# Patient Record
Sex: Female | Born: 1980 | Race: White | Hispanic: No | Marital: Single | Smoking: Former smoker
Health system: Southern US, Community
[De-identification: ages and names within clinical notes are randomized; demographics above are authoritative.]

## PROBLEM LIST (undated history)

## (undated) DIAGNOSIS — R51 Headache: Secondary | ICD-10-CM

## (undated) DIAGNOSIS — Z8709 Personal history of other diseases of the respiratory system: Secondary | ICD-10-CM

## (undated) DIAGNOSIS — Z789 Other specified health status: Secondary | ICD-10-CM

## (undated) DIAGNOSIS — F419 Anxiety disorder, unspecified: Secondary | ICD-10-CM

## (undated) DIAGNOSIS — K219 Gastro-esophageal reflux disease without esophagitis: Secondary | ICD-10-CM

## (undated) DIAGNOSIS — I209 Angina pectoris, unspecified: Secondary | ICD-10-CM

## (undated) DIAGNOSIS — Z8489 Family history of other specified conditions: Secondary | ICD-10-CM

## (undated) DIAGNOSIS — F32A Depression, unspecified: Secondary | ICD-10-CM

## (undated) DIAGNOSIS — F329 Major depressive disorder, single episode, unspecified: Secondary | ICD-10-CM

## (undated) DIAGNOSIS — R519 Headache, unspecified: Secondary | ICD-10-CM

## (undated) DIAGNOSIS — C801 Malignant (primary) neoplasm, unspecified: Secondary | ICD-10-CM

## (undated) HISTORY — PX: ABDOMINAL HYSTERECTOMY: SHX81

---

## 2009-11-03 ENCOUNTER — Emergency Department (HOSPITAL_COMMUNITY): Admission: EM | Admit: 2009-11-03 | Discharge: 2009-11-03 | Payer: Self-pay | Admitting: Emergency Medicine

## 2010-11-28 ENCOUNTER — Inpatient Hospital Stay (HOSPITAL_COMMUNITY)
Admission: AD | Admit: 2010-11-28 | Discharge: 2010-11-28 | Disposition: A | Discharge status: Home / Self Care | Payer: BLUE CROSS/BLUE SHIELD | Source: Ambulatory Visit | Attending: Obstetrics & Gynecology | Admitting: Obstetrics & Gynecology

## 2010-11-28 DIAGNOSIS — N949 Unspecified condition associated with female genital organs and menstrual cycle: Secondary | ICD-10-CM | POA: Insufficient documentation

## 2010-11-28 DIAGNOSIS — N938 Other specified abnormal uterine and vaginal bleeding: Secondary | ICD-10-CM | POA: Insufficient documentation

## 2010-11-28 DIAGNOSIS — R109 Unspecified abdominal pain: Secondary | ICD-10-CM | POA: Insufficient documentation

## 2010-11-28 LAB — URINALYSIS, ROUTINE W REFLEX MICROSCOPIC
Bilirubin Urine: NEGATIVE
Glucose, UA: NEGATIVE mg/dL
Nitrite: NEGATIVE
Protein, ur: NEGATIVE mg/dL
Specific Gravity, Urine: 1.02 (ref 1.005–1.030)

## 2010-11-28 LAB — URINE MICROSCOPIC-ADD ON

## 2010-11-29 ENCOUNTER — Emergency Department (HOSPITAL_COMMUNITY)
Admission: EM | Admit: 2010-11-29 | Discharge: 2010-11-29 | Disposition: A | Discharge status: Home / Self Care | Payer: BLUE CROSS/BLUE SHIELD | Attending: Emergency Medicine | Admitting: Emergency Medicine

## 2010-11-29 DIAGNOSIS — A499 Bacterial infection, unspecified: Secondary | ICD-10-CM | POA: Insufficient documentation

## 2010-11-29 DIAGNOSIS — N898 Other specified noninflammatory disorders of vagina: Secondary | ICD-10-CM | POA: Insufficient documentation

## 2010-11-29 DIAGNOSIS — N760 Acute vaginitis: Secondary | ICD-10-CM | POA: Insufficient documentation

## 2010-11-29 DIAGNOSIS — B9689 Other specified bacterial agents as the cause of diseases classified elsewhere: Secondary | ICD-10-CM | POA: Insufficient documentation

## 2010-11-29 DIAGNOSIS — R109 Unspecified abdominal pain: Secondary | ICD-10-CM | POA: Insufficient documentation

## 2010-11-29 LAB — POCT I-STAT, CHEM 8
Creatinine, Ser: 1 mg/dL (ref 0.4–1.2)
Glucose, Bld: 97 mg/dL (ref 70–99)
HCT: 44 % (ref 36.0–46.0)
Potassium: 3.5 mEq/L (ref 3.5–5.1)
TCO2: 25 mmol/L (ref 0–100)

## 2010-11-29 LAB — URINALYSIS, ROUTINE W REFLEX MICROSCOPIC
Glucose, UA: NEGATIVE mg/dL
Ketones, ur: NEGATIVE mg/dL
Leukocytes, UA: NEGATIVE
Nitrite: NEGATIVE
Specific Gravity, Urine: 1.011 (ref 1.005–1.030)
pH: 6 (ref 5.0–8.0)

## 2010-11-29 LAB — URINE MICROSCOPIC-ADD ON

## 2010-11-29 LAB — WET PREP, GENITAL: Trich, Wet Prep: NONE SEEN

## 2010-12-01 LAB — GC/CHLAMYDIA PROBE AMP, GENITAL
Chlamydia, DNA Probe: NEGATIVE
GC Probe Amp, Genital: NEGATIVE

## 2011-03-10 ENCOUNTER — Encounter (HOSPITAL_COMMUNITY)
Admission: RE | Admit: 2011-03-10 | Discharge: 2011-03-10 | Disposition: A | Discharge status: Home / Self Care | Payer: BC Managed Care – PPO | Source: Ambulatory Visit | Attending: Obstetrics and Gynecology | Admitting: Obstetrics and Gynecology

## 2011-03-10 ENCOUNTER — Encounter (HOSPITAL_COMMUNITY): Payer: Self-pay

## 2011-03-10 HISTORY — DX: Other specified health status: Z78.9

## 2011-03-10 LAB — CBC
Hemoglobin: 13.6 g/dL (ref 12.0–15.0)
MCHC: 34 g/dL (ref 30.0–36.0)
MCV: 87.5 fL (ref 78.0–100.0)
RBC: 4.57 MIL/uL (ref 3.87–5.11)
WBC: 10.2 10*3/uL (ref 4.0–10.5)

## 2011-03-10 LAB — SURGICAL PCR SCREEN: Staphylococcus aureus: NEGATIVE

## 2011-03-10 NOTE — Patient Instructions (Addendum)
21 Rachael Howard  03/10/2011   Your procedure is scheduled on:  03/18/11  11:45  Report to [facility] at10:15 AM.  Call this number if you have problems the morning of surgery: [phone]   Remember:   Do not eat food:After Midnight.  Do not drink clear liquids: After Midnight.  Take these medicines the morning of surgery with A SIP OF WATER: NA   Do not wear jewelry, make-up or nail polish.  Do not bring valuables to the hospital.  Contacts, dentures or bridgework may not be worn into surgery.  Leave suitcase in the car. After surgery it may be brought to your room.  For patients admitted to the hospital, checkout time is 11:00 AM the day of discharge.   Patients discharged the day of surgery will not be allowed to drive home.  Name and phone number of your driver: Undecided  Special Instructions:use CHG wash, 1/2bottle PM before surgery and 1/2bottle AM of surgery

## 2011-03-17 NOTE — H&P (Signed)
30 y.o. yo complains of menometrarrhagia, dysmenorrhea and dyspareunia.  Patient has not had relief from medical management.  We now suspect endometriosis and patient desires diagnosis and treatment.  Past Medical History  Diagnosis Date  . No pertinent past medical history    Past Surgical History  Procedure Date  . Cesarean section 2006    History   Social History  . Marital Status: Married    Spouse Name: N/A    Number of Children: N/A  . Years of Education: N/A   Occupational History  . Not on file.   Social History Main Topics  . Smoking status: Current Everyday Smoker  . Smokeless tobacco: Not on file  . Alcohol Use: No  . Drug Use: No  . Sexually Active:    Other Topics Concern  . Not on file   Social History Narrative  . No narrative on file    No current facility-administered medications on file prior to encounter.   No current outpatient prescriptions on file prior to encounter.  Meds- valtrex, loovral  Allergies  Allergen Reactions  . Erythromycin Shortness Of Breath  . Penicillins Rash    Vitals P  Lungs: clear to ascultation Cor:  RRR Abdomen:  soft, nontender, nondistended. Ex:  no cords, erythema Pelvic:  NEFG, nml s/s uterus without masses.  Pain on exam but no cul de sac studding or masses.    Ultrasound:  6.7x4x4 cm RV uterus, no masses, nml ovaries.  A:  Severe dysmenorrrhea and dyspareunia with menometrarrhagia.   All alternatives, including non-surgical, were discussed with patient and she desires a Diagnostic Laparoscopy with possible resection and cautery of endometriosis or adhesions assisted with the Robot.  P:  Port placement has been discussed with patient.   All risks, benefits and alternatives d/w patient and she desires to proceed with the above procedures .  Patient has undergone a modified bowel prep and will receive preop antibiotics and SCDs during the operation.    Vonceil Upshur A   Annina Piotrowski A

## 2011-03-18 ENCOUNTER — Encounter (HOSPITAL_COMMUNITY)
Admission: RE | Disposition: A | Discharge status: Home / Self Care | Payer: Self-pay | Source: Ambulatory Visit | Attending: Obstetrics and Gynecology

## 2011-03-18 ENCOUNTER — Encounter (HOSPITAL_COMMUNITY): Payer: Self-pay | Admitting: Anesthesiology

## 2011-03-18 ENCOUNTER — Ambulatory Visit (HOSPITAL_COMMUNITY): Payer: BC Managed Care – PPO | Admitting: Anesthesiology

## 2011-03-18 ENCOUNTER — Other Ambulatory Visit: Payer: Self-pay | Admitting: Obstetrics and Gynecology

## 2011-03-18 ENCOUNTER — Ambulatory Visit (HOSPITAL_COMMUNITY)
Admission: RE | Admit: 2011-03-18 | Discharge: 2011-03-18 | Disposition: A | Discharge status: Home / Self Care | Payer: BC Managed Care – PPO | Source: Ambulatory Visit | Attending: Obstetrics and Gynecology | Admitting: Obstetrics and Gynecology

## 2011-03-18 DIAGNOSIS — Z9889 Other specified postprocedural states: Secondary | ICD-10-CM

## 2011-03-18 DIAGNOSIS — Z01812 Encounter for preprocedural laboratory examination: Secondary | ICD-10-CM | POA: Insufficient documentation

## 2011-03-18 DIAGNOSIS — N803 Endometriosis of pelvic peritoneum, unspecified: Secondary | ICD-10-CM | POA: Insufficient documentation

## 2011-03-18 DIAGNOSIS — IMO0002 Reserved for concepts with insufficient information to code with codable children: Secondary | ICD-10-CM | POA: Insufficient documentation

## 2011-03-18 DIAGNOSIS — N946 Dysmenorrhea, unspecified: Secondary | ICD-10-CM | POA: Insufficient documentation

## 2011-03-18 DIAGNOSIS — Z01818 Encounter for other preprocedural examination: Secondary | ICD-10-CM | POA: Insufficient documentation

## 2011-03-18 LAB — PREGNANCY, URINE: Preg Test, Ur: NEGATIVE

## 2011-03-18 SURGERY — ROBOTIC ASSISTED LAPAROSCOPIC VAGINAL HYSTERECTOMY LEVEL 1
Anesthesia: General | Site: Abdomen | Wound class: Clean Contaminated

## 2011-03-18 MED ORDER — HYDROCODONE-ACETAMINOPHEN 5-500 MG PO TABS: 1.0000 | ORAL_TABLET | Freq: Four times a day (QID) | ORAL | Status: AC | PRN

## 2011-03-18 MED ORDER — LIDOCAINE HCL (CARDIAC) 20 MG/ML IV SOLN
INTRAVENOUS | Status: AC
  Filled 2011-03-18: qty 5

## 2011-03-18 MED ORDER — ROCURONIUM BROMIDE 100 MG/10ML IV SOLN
INTRAVENOUS | Status: DC | PRN
  Administered 2011-03-18: 50 mg via INTRAVENOUS

## 2011-03-18 MED ORDER — HYDROCODONE-ACETAMINOPHEN 5-500 MG PO TABS: 1.0000 | ORAL_TABLET | Freq: Four times a day (QID) | ORAL | Status: DC | PRN

## 2011-03-18 MED ORDER — KETOROLAC TROMETHAMINE 30 MG/ML IJ SOLN
INTRAMUSCULAR | Status: AC
  Filled 2011-03-18: qty 1

## 2011-03-18 MED ORDER — LIDOCAINE HCL (CARDIAC) 20 MG/ML IV SOLN
INTRAVENOUS | Status: DC | PRN
  Administered 2011-03-18: 60 mg via INTRAVENOUS

## 2011-03-18 MED ORDER — FENTANYL CITRATE 0.05 MG/ML IJ SOLN
INTRAMUSCULAR | Status: AC
  Administered 2011-03-18: 50 ug via INTRAVENOUS
  Filled 2011-03-18: qty 2

## 2011-03-18 MED ORDER — MEPERIDINE HCL 25 MG/ML IJ SOLN: 6.2500 mg | INTRAMUSCULAR | Status: DC | PRN

## 2011-03-18 MED ORDER — MIDAZOLAM HCL 5 MG/5ML IJ SOLN
INTRAMUSCULAR | Status: DC | PRN
  Administered 2011-03-18: 2 mg via INTRAVENOUS

## 2011-03-18 MED ORDER — LACTATED RINGERS IR SOLN
Status: DC | PRN
  Administered 2011-03-18: 1

## 2011-03-18 MED ORDER — PROPOFOL 10 MG/ML IV EMUL
INTRAVENOUS | Status: DC | PRN
  Administered 2011-03-18: 150 mg via INTRAVENOUS

## 2011-03-18 MED ORDER — GLYCOPYRROLATE 0.2 MG/ML IJ SOLN
INTRAMUSCULAR | Status: AC
  Filled 2011-03-18: qty 2

## 2011-03-18 MED ORDER — ONDANSETRON HCL 4 MG/2ML IJ SOLN
INTRAMUSCULAR | Status: DC | PRN
  Administered 2011-03-18: 4 mg via INTRAVENOUS

## 2011-03-18 MED ORDER — FENTANYL CITRATE 0.05 MG/ML IJ SOLN
25.0000 ug | INTRAMUSCULAR | Status: DC | PRN
  Administered 2011-03-18 (×2): 50 ug via INTRAVENOUS

## 2011-03-18 MED ORDER — KETOROLAC TROMETHAMINE 30 MG/ML IJ SOLN
INTRAMUSCULAR | Status: DC | PRN
  Administered 2011-03-18: 30 mg via INTRAVENOUS

## 2011-03-18 MED ORDER — SUFENTANIL CITRATE 50 MCG/ML IV SOLN
INTRAVENOUS | Status: AC
  Filled 2011-03-18: qty 1

## 2011-03-18 MED ORDER — HYDROCODONE-ACETAMINOPHEN 5-325 MG PO TABS
1.0000 | ORAL_TABLET | Freq: Four times a day (QID) | ORAL | Status: DC | PRN
  Administered 2011-03-18: 2 via ORAL
  Filled 2011-03-18: qty 2

## 2011-03-18 MED ORDER — DEXAMETHASONE SODIUM PHOSPHATE 10 MG/ML IJ SOLN
INTRAMUSCULAR | Status: AC
  Filled 2011-03-18: qty 1

## 2011-03-18 MED ORDER — ONDANSETRON HCL 4 MG/2ML IJ SOLN: 4.0000 mg | Freq: Once | INTRAMUSCULAR | Status: DC | PRN

## 2011-03-18 MED ORDER — NEOSTIGMINE METHYLSULFATE 1 MG/ML IJ SOLN
INTRAMUSCULAR | Status: DC | PRN
  Administered 2011-03-18: 2 mg via INTRAMUSCULAR

## 2011-03-18 MED ORDER — ROCURONIUM BROMIDE 50 MG/5ML IV SOLN
INTRAVENOUS | Status: AC
  Filled 2011-03-18: qty 1

## 2011-03-18 MED ORDER — PROPOFOL 10 MG/ML IV EMUL
INTRAVENOUS | Status: AC
  Filled 2011-03-18: qty 40

## 2011-03-18 MED ORDER — NEOSTIGMINE METHYLSULFATE 1 MG/ML IJ SOLN
INTRAMUSCULAR | Status: AC
  Filled 2011-03-18: qty 10

## 2011-03-18 MED ORDER — GLYCOPYRROLATE 0.2 MG/ML IJ SOLN
INTRAMUSCULAR | Status: DC | PRN
  Administered 2011-03-18: .4 mg via INTRAVENOUS

## 2011-03-18 MED ORDER — ONDANSETRON HCL 4 MG/2ML IJ SOLN
INTRAMUSCULAR | Status: AC
  Filled 2011-03-18: qty 2

## 2011-03-18 MED ORDER — ACETAMINOPHEN 325 MG PO TABS: 325.0000 mg | ORAL_TABLET | ORAL | Status: DC | PRN

## 2011-03-18 MED ORDER — DEXAMETHASONE SODIUM PHOSPHATE 10 MG/ML IJ SOLN
INTRAMUSCULAR | Status: DC | PRN
  Administered 2011-03-18: 10 mg via INTRAVENOUS

## 2011-03-18 MED ORDER — SUFENTANIL CITRATE 50 MCG/ML IV SOLN
INTRAVENOUS | Status: DC | PRN
  Administered 2011-03-18: 15 ug via INTRAVENOUS
  Administered 2011-03-18 (×2): 10 ug via INTRAVENOUS
  Administered 2011-03-18: 15 ug via INTRAVENOUS

## 2011-03-18 MED ORDER — MIDAZOLAM HCL 2 MG/2ML IJ SOLN
INTRAMUSCULAR | Status: AC
  Filled 2011-03-18: qty 2

## 2011-03-18 MED ORDER — LACTATED RINGERS IV SOLN
INTRAVENOUS | Status: DC
  Administered 2011-03-18 (×3): via INTRAVENOUS

## 2011-03-18 SURGICAL SUPPLY — 32 items
CABLE HIGH FREQUENCY MONO STRZ (ELECTRODE) ×3 IMPLANT
CATH ROBINSON RED A/P 16FR (CATHETERS) ×3 IMPLANT
CHLORAPREP W/TINT 26ML (MISCELLANEOUS) ×3 IMPLANT
CLOTH BEACON ORANGE TIMEOUT ST (SAFETY) ×3 IMPLANT
DERMABOND ADVANCED (GAUZE/BANDAGES/DRESSINGS) ×6 IMPLANT
DRAPE C-ARM 42X72 X-RAY (DRAPES) ×3 IMPLANT
DRAPE CAMERA ARM DA VINCI (DRAPE) ×3 IMPLANT
DRAPE ROBOTICS STRL (DRAPES) ×3 IMPLANT
DRAPE UTILITY XL STRL (DRAPES) ×3 IMPLANT
GLOVE BIO SURGEON STRL SZ7 (GLOVE) ×6 IMPLANT
GOWN PREVENTION PLUS LG XLONG (DISPOSABLE) ×6 IMPLANT
KIT DISP ACCESSORY 4 ARM (KITS) ×3 IMPLANT
LIGASURE 5MM LAPAROSCOPIC (INSTRUMENTS) IMPLANT
NEEDLE INSUFFLATION 14GA 120MM (NEEDLE) ×3 IMPLANT
NS IRRIG 1000ML POUR BTL (IV SOLUTION) ×3 IMPLANT
PACK LAPAROSCOPY BASIN (CUSTOM PROCEDURE TRAY) ×3 IMPLANT
POSITIONER SURGICAL ARM (MISCELLANEOUS) ×3 IMPLANT
POUCH SPECIMEN RETRIEVAL 10MM (ENDOMECHANICALS) IMPLANT
SEALER TISSUE G2 CVD JAW 35 (ENDOMECHANICALS) IMPLANT
SEALER TISSUE G2 CVD JAW 45CM (ENDOMECHANICALS)
SEPRAFILM MEMBRANE 5X6 (MISCELLANEOUS) ×6 IMPLANT
SET IRRIG TUBING LAPAROSCOPIC (IRRIGATION / IRRIGATOR) IMPLANT
SOLUTION ELECTROLUBE (MISCELLANEOUS) IMPLANT
SUT VIC AB 2-0 UR5 27 (SUTURE) ×6 IMPLANT
SUT VICRYL RAPIDE 3 0 (SUTURE) ×3 IMPLANT
SYR 5ML LL (SYRINGE) ×3 IMPLANT
TOWEL OR 17X24 6PK STRL BLUE (TOWEL DISPOSABLE) ×6 IMPLANT
TROCAR 12M 150ML BLUNT (TROCAR) ×3 IMPLANT
TROCAR Z-THREAD BLADED 11X100M (TROCAR) ×3 IMPLANT
TROCAR Z-THREAD BLADED 12X100M (TROCAR) ×3 IMPLANT
WARMER LAPAROSCOPE (MISCELLANEOUS) ×3 IMPLANT
WATER STERILE IRR 1000ML POUR (IV SOLUTION) ×3 IMPLANT

## 2011-03-18 NOTE — Anesthesia Preprocedure Evaluation (Addendum)
Anesthesia Evaluation  Name, MR# and DOB Patient awake  General Assessment Comment  Reviewed: Allergy & Precautions, H&P , Patient's Chart, lab work & pertinent test results and reviewed documented beta blocker date and time   Airway Mallampati: II TM Distance: >3 FB Neck ROM: full    Dental No notable dental hx (+) Caps and Poor Dentition   Pulmonary  clear to auscultation  pulmonary exam normal   Cardiovascular regular Normal   Neuro/Psych  GI/Hepatic/Renal   Endo/Other   Abdominal   Musculoskeletal  Hematology   Peds  Reproductive/Obstetrics   Anesthesia Other Findings             Anesthesia Physical Anesthesia Plan  ASA: II  Anesthesia Plan: General   Post-op Pain Management:    Induction: Intravenous  Airway Management Planned: Oral ETT  Additional Equipment:   Intra-op Plan:   Post-operative Plan:   Informed Consent: I have reviewed the patients History and Physical, chart, labs and discussed the procedure including the risks, benefits and alternatives for the proposed anesthesia with the patient or authorized representative who has indicated his/her understanding and acceptance.   Dental Advisory Given  Plan Discussed with: CRNA and Surgeon  Anesthesia Plan Comments: (  Discussed  general anesthesia, including possible nausea, instrumentation of airway, sore throat,pulmonary aspiration, etc. I asked if the were any outstanding questions, or  concerns before we proceeded. )        Anesthesia Quick Evaluation

## 2011-03-18 NOTE — Brief Op Note (Signed)
03/18/2011  12:24 PM  PATIENT:  Rachael Howard  30 y.o. female  PRE-OPERATIVE DIAGNOSIS:  Dysmenorrhea;Dyspareiuna  POST-OPERATIVE DIAGNOSIS:  Dysmenorrhea;Dyspareunia  PROCEDURE:  Procedure(s): ROBOTIC ASSISTED DIAGNOSTIC LAPAROSCOPY WITH RESECTION AND CAUTERY OF ENDOMETRIOSIS AND LYSIS OF ADHESIONS.  SURGEON:  Surgeon(s): Rachael Howard  PHYSICIAN ASSISTANT:   ASSISTANTS: Rachael Solorio BIGELMAN,PA   ANESTHESIA:   general  ESTIMATED BLOOD LOSS:  MINIMAL  IVF:  1500cc  UOP:  Not measured.   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  Ureters out of field of disection.  Omental adhesion to just below umbilicus.  Small adhesion of anterior uterus to bladder reflection.    SPECIMEN:  Biopsy / Limited Resection, UTERINE AND PERITONEAL  DISPOSITION OF SPECIMEN:  PATHOLOGY  COUNTS:  YES  TOURNIQUET:  * No tourniquets in log *  DICTATION #: SEE BELOW  PLAN OF CARE: HOME  TECHNIQUE:  After the patient was properly positioned prepped and draped, Howard speculum was placed the vagina and the cervix grasped with Howard single-tooth tenaculum. The uterine manipulator was placed inside the uterus without complication and the speculum removed. The bladder was drained with Howard red rubber catheter during the procedure. Attention was then turned to the abdomen where Howard 2 cm infraumbilical incision was made with the scalpel and Howard veress needle passed into the abdomen with aspiration of bowel contents or blood. The 10 mm trocar was then placed without complication and the camera inserted and the abdomen the above findings were noted and it was decided to go ahead and proceed with Howard robotic resection of endometriosis. The adhesion of the anterior uterus to the bladder reflection was excised with the hot shears and cautery was used to  achieve hemostasis. Attention was turned to the left uterosacral which was both cauterized and excised cervix superficially to remove any lesions. The ureter was well out of the field dissection during this period he Masterson's defect was then tented up and excised with the hot shears as well. All of these peritoneal biopsies were then placed in the anterior cul-de-sac in preparation for an undocking of the robot.  The uterus was noted posteriorly to have multiple very fine frondular are clear or light papules on it. These were first biopsied with the hot shears and then cauterized with hot shears. The hot shears and PK forceps were then used to remove the omental adhesion to the anterior abdominal wall with good hemostasis achieved. Hemostasis was checked several times by decreasing the intra-abdominal pressure and the robot was then undocked. The scope was placed back inside the abdomen and the peritoneal biopsies were retrieved with atraumatic graspers and the same was done with the uterine biopsies which were sent separately. 60 cc of Seprafilm in Howard slurry with saline were then introduced into the abdominal cavity and posterior cul-de-sac area. All instruments were then withdrawn from the abdomen the abdomen desufflated. The 10-12 mm trocar site fascia was then closed with two figure-of-eight stitches of 2-0 Vicryl. The skin incisions were closed with subcuticular 3-0 Vicryl rapide. The skin incisions were closed with Dermabond. Patient tolerated procedure well and she was returned to the recovery room in stable condition.      PATIENT DISPOSITION:  PACU - hemodynamically stable.   Delay start of Pharmacological VTE agent (>24hrs) due to surgical blood loss or risk of bleeding:  not applicable  Rachael Howard

## 2011-03-18 NOTE — Progress Notes (Signed)
There has been no change in the patients history or status since the history and physical.  Filed Vitals:   03/18/11 0843  BP: 132/88  Pulse: 76  Temp: 98.2 F (36.8 C)  Resp: 18  Height: 5\' 3"  (1.6 m)  Weight: 61.236 kg (135 lb)  SpO2: 100%    Lab Results  Component Value Date   WBC 10.2 03/10/2011   HGB 13.6 03/10/2011   HCT 40.0 03/10/2011   MCV 87.5 03/10/2011   PLT 433* 03/10/2011    Easton Fetty A

## 2011-03-18 NOTE — Transfer of Care (Signed)
Immediate Anesthesia Transfer of Care Note  Patient: Rachael Howard  Procedure(s) Performed:  ROBOTIC ASSISTED LAPAROSCOPIC VAGINAL HYSTERECTOMY LEVEL 1 - Robotic Assisted Diagnostic Laparoscopy with Fulguration of Endometriosis and Lyses of Adhesions  Patient Location: PACU  Anesthesia Type: General  Level of Consciousness: awake, alert  and oriented  Airway & Oxygen Therapy: Patient Spontanous Breathing and Patient connected to nasal cannula oxygen  Post-op Assessment: Report given to PACU RN and Post -op Vital signs reviewed and stable  Post vital signs: Reviewed and stable  Complications: No apparent anesthesia complications

## 2011-03-18 NOTE — Anesthesia Postprocedure Evaluation (Signed)
  Anesthesia Post-op Note  Patient: Rachael Howard  Procedure(s) Performed:  ROBOTIC ASSISTED LAPAROSCOPIC VAGINAL HYSTERECTOMY LEVEL 1 - Robotic Assisted Diagnostic Laparoscopy with Fulguration of Endometriosis and Lyses of Adhesions  No anesthesia complications.  Level of consciousness: alert. Cardiopulmonary status stable.  No follow-up care or observation required.  Jessa Stinson L. Rodman Pickle, MD

## 2011-03-24 NOTE — Discharge Summary (Signed)
Physician Discharge Summary  Patient ID: Rachael Howard MRN: [id number] DOB/AGE: 04/23/1981 30 y.o.  Admit date: 03/18/2011 Discharge date: 03/24/2011  Admission Diagnoses: dysmenorrhea and dyspareunia. Discharge Diagnoses: same Active Problems:  * No active hospital problems. *    Discharged Condition: good  Hospital Course: good  Consults: none  Significant Diagnostic Studies: none  Treatments: surgery: dx laparoscopy and resection and cautery of endometriosis  Discharge Exam: Blood pressure 127/84, pulse 73, temperature 98.9 F (37.2 C), temperature source Oral, resp. rate 18, height 5\' 3"  (1.6 m), weight 61.236 kg (135 lb), SpO2 98.00%. normal  Disposition: Home or Self Care  Discharge Orders    Future Orders Please Complete By Expires   Diet - low sodium heart healthy      Discharge instructions      Comments:   No driving on narcotics, no sexual activity for 2 weeks.   Increase activity slowly      May shower / Bathe      Comments:   Shower, no bath for 2 weeks.   Sexual Activity Restrictions      Comments:   No sexual activity for 2 weeks.   Remove dressing in 24 hours      Call MD for:  temperature >100.4        Discharge Medication List as of 03/18/2011  5:12 PM    CONTINUE these medications which have CHANGED   Details  HYDROcodone-acetaminophen (VICODIN) 5-500 MG per tablet Take 1 tablet by mouth every 6 (six) hours as needed for pain., Starting 03/18/2011, Until Sat 03/28/11, Print      CONTINUE these medications which have NOT CHANGED   Details  calcium-vitamin D (OSCAL) 250-125 MG-UNIT per tablet Take 1 tablet by mouth daily. , Until Discontinued, Historical Med    norgestrel-ethinyl estradiol (LO/OVRAL) 0.3-30 MG-MCG per tablet Take 1 tablet by mouth daily.  , Until Discontinued, Historical Med    valACYclovir (VALTREX) 500 MG tablet Take 500 mg by mouth 2 (two) times daily.  , Until Discontinued, Historical Med      STOP taking these  medications     acetaminophen (TYLENOL) 500 MG tablet          Signed: Nelissa Bolduc A 03/24/2011, 8:30 AM

## 2011-03-28 ENCOUNTER — Emergency Department (HOSPITAL_COMMUNITY)
Admission: EM | Admit: 2011-03-28 | Discharge: 2011-03-28 | Disposition: A | Discharge status: Home / Self Care | Payer: BC Managed Care – PPO | Attending: Emergency Medicine | Admitting: Emergency Medicine

## 2011-03-28 DIAGNOSIS — Z09 Encounter for follow-up examination after completed treatment for conditions other than malignant neoplasm: Secondary | ICD-10-CM | POA: Insufficient documentation

## 2011-03-28 DIAGNOSIS — Z8742 Personal history of other diseases of the female genital tract: Secondary | ICD-10-CM | POA: Insufficient documentation

## 2011-03-28 LAB — CBC
MCHC: 34 g/dL (ref 30.0–36.0)
MCV: 86.1 fL (ref 78.0–100.0)
Platelets: 409 10*3/uL — ABNORMAL HIGH (ref 150–400)
RDW: 13.1 % (ref 11.5–15.5)
WBC: 8.4 10*3/uL (ref 4.0–10.5)

## 2011-03-28 LAB — DIFFERENTIAL
Basophils Absolute: 0.1 10*3/uL (ref 0.0–0.1)
Eosinophils Absolute: 0.4 10*3/uL (ref 0.0–0.7)
Eosinophils Relative: 5 % (ref 0–5)
Lymphs Abs: 1.9 10*3/uL (ref 0.7–4.0)
Monocytes Absolute: 0.7 10*3/uL (ref 0.1–1.0)

## 2011-03-28 LAB — COMPREHENSIVE METABOLIC PANEL
AST: 16 U/L (ref 0–37)
Albumin: 3.7 g/dL (ref 3.5–5.2)
Calcium: 9.4 mg/dL (ref 8.4–10.5)
Chloride: 105 mEq/L (ref 96–112)
Creatinine, Ser: 0.59 mg/dL (ref 0.50–1.10)
Total Bilirubin: 0.3 mg/dL (ref 0.3–1.2)
Total Protein: 7.3 g/dL (ref 6.0–8.3)

## 2011-04-09 ENCOUNTER — Encounter (HOSPITAL_COMMUNITY): Payer: Self-pay | Admitting: Obstetrics and Gynecology

## 2011-12-11 ENCOUNTER — Encounter (HOSPITAL_COMMUNITY): Payer: Self-pay

## 2011-12-22 ENCOUNTER — Encounter (HOSPITAL_COMMUNITY)
Admission: RE | Admit: 2011-12-22 | Discharge: 2011-12-22 | Disposition: A | Discharge status: Home / Self Care | Payer: BC Managed Care – PPO | Source: Ambulatory Visit | Attending: Obstetrics and Gynecology | Admitting: Obstetrics and Gynecology

## 2011-12-22 ENCOUNTER — Encounter (HOSPITAL_COMMUNITY): Payer: Self-pay

## 2011-12-22 LAB — CBC
HCT: 42.6 % (ref 36.0–46.0)
Hemoglobin: 14.6 g/dL (ref 12.0–15.0)
RBC: 4.81 MIL/uL (ref 3.87–5.11)
WBC: 9.1 10*3/uL (ref 4.0–10.5)

## 2011-12-22 LAB — SURGICAL PCR SCREEN
MRSA, PCR: NEGATIVE
Staphylococcus aureus: NEGATIVE

## 2011-12-22 NOTE — Patient Instructions (Signed)
YOUR PROCEDURE IS SCHEDULED ON:12/30/11  ENTER THROUGH THE MAIN ENTRANCE OF [facility] AT:1145am  USE DESK PHONE AND DIAL [phone] TO INFORM us OF YOUR ARRIVAL  CALL [phone] IF YOU HAVE ANY QUESTIONS OR PROBLEMS PRIOR TO YOUR ARRIVAL.  REMEMBER: DO NOT EAT  AFTER MIDNIGHT :Tuesday  SPECIAL INSTRUCTIONS:clear liquids ok until 9am WED   YOU MAY BRUSH YOUR TEETH THE MORNING OF SURGERY   TAKE THESE MEDICINES THE DAY OF SURGERY WITH SIP OF WATER:am meds   DO NOT WEAR JEWELRY, EYE MAKEUP, LIPSTICK OR DARK FINGERNAIL POLISH DO NOT WEAR LOTIONS  DO NOT SHAVE FOR 48 HOURS PRIOR TO SURGERY  YOU WILL NOT BE ALLOWED TO DRIVE YOURSELF HOME.  NAME OF DRIVER:JOE- spouse

## 2011-12-23 ENCOUNTER — Other Ambulatory Visit: Payer: Self-pay | Admitting: Obstetrics and Gynecology

## 2011-12-29 MED ORDER — CIPROFLOXACIN IN D5W 400 MG/200ML IV SOLN
400.0000 mg | INTRAVENOUS | Status: DC
  Filled 2011-12-29: qty 200

## 2011-12-29 MED ORDER — METRONIDAZOLE IN NACL 5-0.79 MG/ML-% IV SOLN
500.0000 mg | INTRAVENOUS | Status: DC
  Filled 2011-12-29: qty 100

## 2011-12-29 NOTE — H&P (Signed)
31 y.o. yo complains of continued pain and dyspareunia from documented endometriosis.  She has been a patient of mine since 2009 and has suffered from dysmenorrhea and dyspareunia.  She did not respond to [facility] management.  She underwent a Robotic endometriosis cautery in July of '12.  The operation resected and cauterized endometriosis along the right uterosacral and multiple lesions on the uterus.  The serosa of the uterus appeared to be covered with clear plaque lesions that were pathologically proven to be endometriosis.  The pt underwent 6 months of Lupron after the surgery without relief.  She also has had intermittant spotting which seems to increase her pain.  The pt has been seen several times since finishing the Lupron and intensive discussions have ensued about hysterectomy and possible ooopherectomy and expected outcomes.  Pt voices understanding about all.  She does not desire future fertility and understands that the hysterectomy will render her infertile.  She understands that even after the hysterectomy she still may have pain that will require physical therapy after and that the goal is to control the pain to a livable level; she may not be pain free even if she has a BSO.  Multiple discussions over several visits have addressed the expected symptoms, including worsening of headaches and postmenopausal symptoms, if we remove both ovaries.  She understands the risks of menopause and the likely need for continued hormonal replacement if she has her ovaries removed.  After discussing all expected outcomes, the need for at least 8 weeks postop without hormones, the need for HRT and the possibility of alternating HRT with hormone free periods to control pain, she desires to have her ovaries removed.  Past Medical History  Diagnosis Date  . No pertinent past medical history    Past Surgical History  Procedure Date  . Cesarean section 2006  . Dilation and curettage of uterus     History    Social History  . Marital Status: Married    Spouse Name: N/A    Number of Children: N/A  . Years of Education: N/A   Occupational History  . Not on file.   Social History Main Topics  . Smoking status: Current Everyday Smoker  . Smokeless tobacco: Not on file  . Alcohol Use: No  . Drug Use: No  . Sexually Active:    Other Topics Concern  . Not on file   Social History Narrative  . No narrative on file    No current facility-administered medications on file prior to encounter.   Current Outpatient Prescriptions on File Prior to Encounter  Medication Sig Dispense Refill  . calcium-vitamin D (OSCAL) 250-125 MG-UNIT per tablet Take 1 tablet by mouth daily.       Marland Kitchen omeprazole (PRILOSEC OTC) 20 MG tablet Take 20 mg by mouth daily. Stopped 4/17      . progesterone (PROMETRIUM) 100 MG capsule Take 100 mg by mouth daily.      . valACYclovir (VALTREX) 500 MG tablet Take 500 mg by mouth daily.         Allergies  Allergen Reactions  . Erythromycin Shortness Of Breath  . Erythrocin Swelling  . Oxycodone-Acetaminophen Nausea And Vomiting  . Penicillins Rash    @VITALS2 @  Lungs: clear to ascultation Cor:  RRR Abdomen:  soft, nontender, nondistended. Ex:  no cords, erythema Pelvic:  NEFG, 7 week size uterus, tender cul de sac; no masses felt.  A:  Severe and unremitting endometriosis after all conservative therapies.  Pt does  not desire future fertility and understands all implications of BSO.   P:  For Robotic TLH/BSO/cautery of endometriosis.   All risks, benefits and alternatives d/w patient and she desires to proceed.  Patient has undergone a modified bowel prep and will receive preop antibiotics and SCDs during the operation.     Arlesia Kiel A

## 2011-12-30 ENCOUNTER — Ambulatory Visit (HOSPITAL_COMMUNITY)
Admission: RE | Admit: 2011-12-30 | Discharge: 2011-12-31 | Disposition: A | Discharge status: Home / Self Care | Payer: BC Managed Care – PPO | Source: Ambulatory Visit | Attending: Obstetrics and Gynecology | Admitting: Obstetrics and Gynecology

## 2011-12-30 ENCOUNTER — Encounter (HOSPITAL_COMMUNITY)
Admission: RE | Disposition: A | Discharge status: Home / Self Care | Payer: Self-pay | Source: Ambulatory Visit | Attending: Obstetrics and Gynecology

## 2011-12-30 ENCOUNTER — Encounter (HOSPITAL_COMMUNITY): Payer: Self-pay | Admitting: Anesthesiology

## 2011-12-30 ENCOUNTER — Ambulatory Visit (HOSPITAL_COMMUNITY): Payer: BC Managed Care – PPO | Admitting: Anesthesiology

## 2011-12-30 DIAGNOSIS — N803 Endometriosis of pelvic peritoneum, unspecified: Secondary | ICD-10-CM | POA: Insufficient documentation

## 2011-12-30 DIAGNOSIS — Z9889 Other specified postprocedural states: Secondary | ICD-10-CM

## 2011-12-30 DIAGNOSIS — IMO0002 Reserved for concepts with insufficient information to code with codable children: Secondary | ICD-10-CM | POA: Insufficient documentation

## 2011-12-30 DIAGNOSIS — N946 Dysmenorrhea, unspecified: Secondary | ICD-10-CM | POA: Insufficient documentation

## 2011-12-30 DIAGNOSIS — N831 Corpus luteum cyst of ovary, unspecified side: Secondary | ICD-10-CM | POA: Insufficient documentation

## 2011-12-30 HISTORY — PX: CYSTOSCOPY: SHX5120

## 2011-12-30 SURGERY — ROBOTIC ASSISTED TOTAL HYSTERECTOMY
Anesthesia: General

## 2011-12-30 MED ORDER — NEOSTIGMINE METHYLSULFATE 1 MG/ML IJ SOLN
INTRAMUSCULAR | Status: DC | PRN
  Administered 2011-12-30: 3 mg via INTRAVENOUS

## 2011-12-30 MED ORDER — INDIGOTINDISULFONATE SODIUM 8 MG/ML IJ SOLN
INTRAMUSCULAR | Status: DC | PRN
  Administered 2011-12-30: 5 mL via INTRAVENOUS

## 2011-12-30 MED ORDER — HYDROMORPHONE HCL PF 1 MG/ML IJ SOLN
0.2500 mg | INTRAMUSCULAR | Status: DC | PRN
  Administered 2011-12-30 (×3): 0.5 mg via INTRAVENOUS

## 2011-12-30 MED ORDER — FENTANYL CITRATE 0.05 MG/ML IJ SOLN
INTRAMUSCULAR | Status: DC | PRN
  Administered 2011-12-30 (×7): 50 ug via INTRAVENOUS

## 2011-12-30 MED ORDER — ROCURONIUM BROMIDE 50 MG/5ML IV SOLN
INTRAVENOUS | Status: AC
  Filled 2011-12-30: qty 1

## 2011-12-30 MED ORDER — ONDANSETRON HCL 4 MG/2ML IJ SOLN
INTRAMUSCULAR | Status: AC
  Filled 2011-12-30: qty 2

## 2011-12-30 MED ORDER — GLYCOPYRROLATE 0.2 MG/ML IJ SOLN
INTRAMUSCULAR | Status: DC | PRN
  Administered 2011-12-30: 0.6 mg via INTRAVENOUS
  Administered 2011-12-30: 0.2 mg via INTRAVENOUS

## 2011-12-30 MED ORDER — VALACYCLOVIR HCL 500 MG PO TABS
500.0000 mg | ORAL_TABLET | Freq: Every day | ORAL | Status: DC
  Administered 2011-12-30: 500 mg via ORAL
  Filled 2011-12-30 (×2): qty 1

## 2011-12-30 MED ORDER — KETOROLAC TROMETHAMINE 30 MG/ML IJ SOLN: 30.0000 mg | Freq: Four times a day (QID) | INTRAMUSCULAR | Status: DC

## 2011-12-30 MED ORDER — HYDROMORPHONE HCL PF 1 MG/ML IJ SOLN
INTRAMUSCULAR | Status: AC
  Administered 2011-12-30: 0.5 mg via INTRAVENOUS
  Filled 2011-12-30: qty 1

## 2011-12-30 MED ORDER — GLYCOPYRROLATE 0.2 MG/ML IJ SOLN
INTRAMUSCULAR | Status: AC
  Filled 2011-12-30: qty 1

## 2011-12-30 MED ORDER — OXYCODONE-ACETAMINOPHEN 5-325 MG PO TABS: 1.0000 | ORAL_TABLET | ORAL | Status: DC | PRN

## 2011-12-30 MED ORDER — INDIGOTINDISULFONATE SODIUM 8 MG/ML IJ SOLN
INTRAMUSCULAR | Status: AC
  Filled 2011-12-30: qty 10

## 2011-12-30 MED ORDER — ONDANSETRON HCL 4 MG/2ML IJ SOLN
INTRAMUSCULAR | Status: DC | PRN
  Administered 2011-12-30: 4 mg via INTRAVENOUS

## 2011-12-30 MED ORDER — KETOROLAC TROMETHAMINE 30 MG/ML IJ SOLN
INTRAMUSCULAR | Status: DC | PRN
  Administered 2011-12-30: 30 mg via INTRAVENOUS

## 2011-12-30 MED ORDER — DEXAMETHASONE SODIUM PHOSPHATE 10 MG/ML IJ SOLN
INTRAMUSCULAR | Status: DC | PRN
  Administered 2011-12-30: 8 mg via INTRAVENOUS

## 2011-12-30 MED ORDER — PROPOFOL 10 MG/ML IV EMUL
INTRAVENOUS | Status: AC
  Filled 2011-12-30: qty 20

## 2011-12-30 MED ORDER — MENTHOL 3 MG MT LOZG: 1.0000 | LOZENGE | OROMUCOSAL | Status: DC | PRN

## 2011-12-30 MED ORDER — LIDOCAINE HCL (CARDIAC) 20 MG/ML IV SOLN
INTRAVENOUS | Status: DC | PRN
  Administered 2011-12-30: 80 mg via INTRAVENOUS

## 2011-12-30 MED ORDER — METRONIDAZOLE IN NACL 5-0.79 MG/ML-% IV SOLN
INTRAVENOUS | Status: DC | PRN
  Administered 2011-12-30: 500 mg via INTRAVENOUS

## 2011-12-30 MED ORDER — ROCURONIUM BROMIDE 100 MG/10ML IV SOLN
INTRAVENOUS | Status: DC | PRN
  Administered 2011-12-30: 10 mg via INTRAVENOUS
  Administered 2011-12-30: 50 mg via INTRAVENOUS

## 2011-12-30 MED ORDER — IBUPROFEN 800 MG PO TABS
800.0000 mg | ORAL_TABLET | Freq: Three times a day (TID) | ORAL | Status: DC | PRN
  Administered 2011-12-30 – 2011-12-31 (×2): 800 mg via ORAL
  Filled 2011-12-30 (×2): qty 1

## 2011-12-30 MED ORDER — KETOROLAC TROMETHAMINE 30 MG/ML IJ SOLN: 15.0000 mg | Freq: Once | INTRAMUSCULAR | Status: DC | PRN

## 2011-12-30 MED ORDER — PANTOPRAZOLE SODIUM 40 MG PO TBEC
40.0000 mg | DELAYED_RELEASE_TABLET | Freq: Every day | ORAL | Status: DC
  Administered 2011-12-30: 40 mg via ORAL
  Filled 2011-12-30 (×2): qty 1

## 2011-12-30 MED ORDER — ACETAMINOPHEN 10 MG/ML IV SOLN: 1000.0000 mg | Freq: Once | INTRAVENOUS | Status: DC

## 2011-12-30 MED ORDER — ZOLPIDEM TARTRATE 5 MG PO TABS: 5.0000 mg | ORAL_TABLET | Freq: Every evening | ORAL | Status: DC | PRN

## 2011-12-30 MED ORDER — LACTATED RINGERS IV SOLN
INTRAVENOUS | Status: DC
  Administered 2011-12-30 (×2): via INTRAVENOUS

## 2011-12-30 MED ORDER — FENTANYL CITRATE 0.05 MG/ML IJ SOLN
INTRAMUSCULAR | Status: AC
  Filled 2011-12-30: qty 10

## 2011-12-30 MED ORDER — LIDOCAINE HCL (CARDIAC) 20 MG/ML IV SOLN
INTRAVENOUS | Status: AC
  Filled 2011-12-30: qty 5

## 2011-12-30 MED ORDER — ONDANSETRON HCL 4 MG PO TABS: 4.0000 mg | ORAL_TABLET | Freq: Four times a day (QID) | ORAL | Status: DC | PRN

## 2011-12-30 MED ORDER — MIDAZOLAM HCL 2 MG/2ML IJ SOLN
INTRAMUSCULAR | Status: AC
  Filled 2011-12-30: qty 2

## 2011-12-30 MED ORDER — KETOROLAC TROMETHAMINE 30 MG/ML IJ SOLN
INTRAMUSCULAR | Status: AC
  Filled 2011-12-30: qty 1

## 2011-12-30 MED ORDER — CIPROFLOXACIN IN D5W 400 MG/200ML IV SOLN
INTRAVENOUS | Status: DC | PRN
  Administered 2011-12-30: 400 mg via INTRAVENOUS

## 2011-12-30 MED ORDER — DEXAMETHASONE SODIUM PHOSPHATE 10 MG/ML IJ SOLN
INTRAMUSCULAR | Status: AC
  Filled 2011-12-30: qty 1

## 2011-12-30 MED ORDER — PROPOFOL 10 MG/ML IV EMUL
INTRAVENOUS | Status: DC | PRN
  Administered 2011-12-30: 200 mg via INTRAVENOUS

## 2011-12-30 MED ORDER — ONDANSETRON HCL 4 MG/2ML IJ SOLN: 4.0000 mg | Freq: Four times a day (QID) | INTRAMUSCULAR | Status: DC | PRN

## 2011-12-30 MED ORDER — INDIGOTINDISULFONATE SODIUM 8 MG/ML IJ SOLN
INTRAMUSCULAR | Status: AC
  Filled 2011-12-30: qty 5

## 2011-12-30 MED ORDER — NEOSTIGMINE METHYLSULFATE 1 MG/ML IJ SOLN
INTRAMUSCULAR | Status: AC
  Filled 2011-12-30: qty 10

## 2011-12-30 MED ORDER — ARTIFICIAL TEARS OP OINT
TOPICAL_OINTMENT | OPHTHALMIC | Status: AC
  Filled 2011-12-30: qty 3.5

## 2011-12-30 MED ORDER — MIDAZOLAM HCL 5 MG/5ML IJ SOLN
INTRAMUSCULAR | Status: DC | PRN
  Administered 2011-12-30: 2 mg via INTRAVENOUS

## 2011-12-30 MED ORDER — HYDROMORPHONE HCL 2 MG PO TABS
2.0000 mg | ORAL_TABLET | ORAL | Status: DC | PRN
  Administered 2011-12-30 – 2011-12-31 (×6): 2 mg via ORAL
  Filled 2011-12-30 (×6): qty 1

## 2011-12-30 MED ORDER — OMEPRAZOLE MAGNESIUM 20 MG PO TBEC: 20.0000 mg | DELAYED_RELEASE_TABLET | Freq: Every day | ORAL | Status: DC

## 2011-12-30 SURGICAL SUPPLY — 75 items
BAG URINE DRAINAGE (UROLOGICAL SUPPLIES) ×3 IMPLANT
BARRIER ADHS 3X4 INTERCEED (GAUZE/BANDAGES/DRESSINGS) ×3 IMPLANT
BENZOIN TINCTURE PRP APPL 2/3 (GAUZE/BANDAGES/DRESSINGS) ×3 IMPLANT
CABLE HIGH FREQUENCY MONO STRZ (ELECTRODE) ×3 IMPLANT
CATH FOLEY 3WAY  5CC 16FR (CATHETERS) ×1
CATH FOLEY 3WAY 5CC 16FR (CATHETERS) ×2 IMPLANT
CHLORAPREP W/TINT 26ML (MISCELLANEOUS) ×3 IMPLANT
CLOTH BEACON ORANGE TIMEOUT ST (SAFETY) ×3 IMPLANT
CONT PATH 16OZ SNAP LID 3702 (MISCELLANEOUS) ×3 IMPLANT
COVER MAYO STAND STRL (DRAPES) ×3 IMPLANT
COVER TABLE BACK 60X90 (DRAPES) ×6 IMPLANT
COVER TIP SHEARS 8 DVNC (MISCELLANEOUS) ×2 IMPLANT
COVER TIP SHEARS 8MM DA VINCI (MISCELLANEOUS) ×1
DECANTER SPIKE VIAL GLASS SM (MISCELLANEOUS) ×3 IMPLANT
DERMABOND ADVANCED (GAUZE/BANDAGES/DRESSINGS) ×1
DERMABOND ADVANCED .7 DNX12 (GAUZE/BANDAGES/DRESSINGS) ×2 IMPLANT
DRAPE HUG U DISPOSABLE (DRAPE) ×3 IMPLANT
DRAPE LG THREE QUARTER DISP (DRAPES) ×6 IMPLANT
DRAPE MONITOR DA VINCI (DRAPE) IMPLANT
DRAPE WARM FLUID 44X44 (DRAPE) ×3 IMPLANT
ELECT REM PT RETURN 9FT ADLT (ELECTROSURGICAL) ×3
ELECTRODE REM PT RTRN 9FT ADLT (ELECTROSURGICAL) ×2 IMPLANT
EVACUATOR SMOKE 8.L (FILTER) ×3 IMPLANT
GAUZE VASELINE 3X9 (GAUZE/BANDAGES/DRESSINGS) IMPLANT
GLOVE BIO SURGEON STRL SZ 6.5 (GLOVE) ×3 IMPLANT
GLOVE BIO SURGEON STRL SZ7 (GLOVE) ×9 IMPLANT
GLOVE BIOGEL PI IND STRL 6.5 (GLOVE) ×2 IMPLANT
GLOVE BIOGEL PI INDICATOR 6.5 (GLOVE) ×1
GLOVE ECLIPSE 6.5 STRL STRAW (GLOVE) ×9 IMPLANT
GOWN STRL REIN XL XLG (GOWN DISPOSABLE) ×18 IMPLANT
GYRUS RUMI II 2.5CM BLUE (DISPOSABLE) ×3
GYRUS RUMI II 3.5CM BLUE (DISPOSABLE)
GYRUS RUMI II 4.0CM BLUE (DISPOSABLE)
KIT ACCESSORY DA VINCI DISP (KITS) ×1
KIT ACCESSORY DVNC DISP (KITS) ×2 IMPLANT
KIT DISP ACCESSORY 4 ARM (KITS) IMPLANT
MANIPULATOR UTERINE 4.5 ZUMI (MISCELLANEOUS) IMPLANT
NEEDLE INSUFFLATION 14GA 120MM (NEEDLE) ×3 IMPLANT
NS IRRIG 1000ML POUR BTL (IV SOLUTION) ×9 IMPLANT
OCCLUDER COLPOPNEUMO (BALLOONS) ×6 IMPLANT
PACK LAVH (CUSTOM PROCEDURE TRAY) ×3 IMPLANT
PAD PREP 24X48 CUFFED NSTRL (MISCELLANEOUS) ×6 IMPLANT
PLUG CATH AND CAP STER (CATHETERS) ×3 IMPLANT
PROTECTOR NERVE ULNAR (MISCELLANEOUS) ×6 IMPLANT
RUMI II 3.0CM BLUE KOH-EFFICIE (DISPOSABLE) IMPLANT
RUMI II GYRUS 2.5CM BLUE (DISPOSABLE) ×2 IMPLANT
RUMI II GYRUS 3.5CM BLUE (DISPOSABLE) IMPLANT
RUMI II GYRUS 4.0CM BLUE (DISPOSABLE) IMPLANT
SET CYSTO W/LG BORE CLAMP LF (SET/KITS/TRAYS/PACK) IMPLANT
SET IRRIG TUBING LAPAROSCOPIC (IRRIGATION / IRRIGATOR) ×3 IMPLANT
SOLUTION ELECTROLUBE (MISCELLANEOUS) ×3 IMPLANT
SPONGE LAP 18X18 X RAY DECT (DISPOSABLE) IMPLANT
STRIP CLOSURE SKIN 1/2X4 (GAUZE/BANDAGES/DRESSINGS) ×3 IMPLANT
SUT VIC AB 0 CT1 27 (SUTURE) ×5
SUT VIC AB 0 CT1 27XBRD ANBCTR (SUTURE) ×10 IMPLANT
SUT VIC AB 2-0 CT2 27 (SUTURE) ×6 IMPLANT
SUT VICRYL 0 UR6 27IN ABS (SUTURE) ×6 IMPLANT
SUT VICRYL RAPIDE 3 0 (SUTURE) ×6 IMPLANT
SUT VLOC 180 0 9IN  GS21 (SUTURE) ×1
SUT VLOC 180 0 9IN GS21 (SUTURE) ×2 IMPLANT
SYR 50ML LL SCALE MARK (SYRINGE) ×3 IMPLANT
SYSTEM CONVERTIBLE TROCAR (TROCAR) IMPLANT
TIP UTERINE 5.1X6CM LAV DISP (MISCELLANEOUS) IMPLANT
TIP UTERINE 6.7X10CM GRN DISP (MISCELLANEOUS) IMPLANT
TIP UTERINE 6.7X6CM WHT DISP (MISCELLANEOUS) IMPLANT
TIP UTERINE 6.7X8CM BLUE DISP (MISCELLANEOUS) ×3 IMPLANT
TOWEL OR 17X24 6PK STRL BLUE (TOWEL DISPOSABLE) ×6 IMPLANT
TROCAR DISP BLADELESS 8 DVNC (TROCAR) ×2 IMPLANT
TROCAR DISP BLADELESS 8MM (TROCAR) ×1
TROCAR XCEL 12X100 BLDLESS (ENDOMECHANICALS) ×3 IMPLANT
TROCAR XCEL NON-BLD 5MMX100MML (ENDOMECHANICALS) ×3 IMPLANT
TROCAR Z-THREAD 12X150 (TROCAR) IMPLANT
TROCAR Z-THREAD BLADED 12X100M (TROCAR) IMPLANT
TUBING FILTER THERMOFLATOR (ELECTROSURGICAL) ×3 IMPLANT
WATER STERILE IRR 1000ML POUR (IV SOLUTION) ×9 IMPLANT

## 2011-12-30 NOTE — Transfer of Care (Signed)
Immediate Anesthesia Transfer of Care Note  Patient: PRIYA MATSEN  Procedure(s) Performed: Procedure(s) (LRB): ROBOTIC ASSISTED TOTAL HYSTERECTOMY (N/A) ROBOTIC ASSISTED BILATERAL SALPINGO OOPHERECTOMY (Bilateral) CYSTOSCOPY (N/A)  Patient Location: PACU  Anesthesia Type: General  Level of Consciousness: awake, alert  and oriented  Airway & Oxygen Therapy: Patient Spontanous Breathing and Patient connected to nasal cannula oxygen  Post-op Assessment: Report given to PACU RN and Post -op Vital signs reviewed and stable  Post vital signs: stable  Complications: No apparent anesthesia complications

## 2011-12-30 NOTE — Progress Notes (Signed)
Patient is eating, ambulating, not voiding yet.  Pain control is good.  BP 128/76  Pulse 89  Temp(Src) 97.8 F (36.6 C) (Oral)  Resp 12  SpO2 99%  lungs:   clear to auscultation cor:    RRR Abdomen:  soft, appropriate tenderness, incisions intact and without erythema or exudate. ex:    no cords   Lab Results  Component Value Date   WBC 9.1 12/22/2011   HGB 14.6 12/22/2011   HCT 42.6 12/22/2011   MCV 88.6 12/22/2011   PLT 381 12/22/2011    A/P  Routine care.  Expect d/c per plan.

## 2011-12-30 NOTE — Op Note (Addendum)
12/30/2011  2:29 PM  PATIENT:  Rachael Howard  31 y.o. female  PRE-OPERATIVE DIAGNOSIS:  Endometriosis, severe  POST-OPERATIVE DIAGNOSIS:  Endometriosis, severe  PROCEDURE:  Procedure(s) (LRB): ROBOTIC ASSISTED TOTAL HYSTERECTOMY (N/A) ROBOTIC ASSISTED BILATERAL SALPINGO OOPHERECTOMY (Bilateral) CYSTOSCOPY (N/A)  SURGEON:  Surgeon(s) and Role:    * Loney Laurence, MD - Primary    * Philip Aspen, DO - Assisting  ANESTHESIA:   general  EBL:  Total I/O In: 600 [I.V.:600] Out: 200 [Urine:100; Blood:100]  SPECIMEN:  Source of Specimen:  uterus, cervix, ovaries and tubes  DISPOSITION OF SPECIMEN:  PATHOLOGY  COUNTS:  YES  PLAN OF CARE: Admit for overnight observation  PATIENT DISPOSITION:  PACU - hemodynamically stable.   Delay start of Pharmacological VTE agent (>24hrs) due to surgical blood loss or risk of bleeding: not applicable  Complications:  None.  Findings:  8 weeks size uterus with multiple clear plaques of endometriosis on serosal surface.  Residual sutures from interceed were seen over L uterosacral.  A small but deep masterson defect was seen in the right cul de sac. The appendix and liver edge were normal. Ovaries appeared normal.  The ureters were identified during multiple points of the case and were always out of the field of dissection.  On cystoscopy, the bladder was intact and bilateral spill was seen from each ureteral orriface.    Medications:  Ancef.  Indigo carmine.    Technique:  After adequate anesthesia was achieved the patient was positioned, prepped and draped in usual sterile fashion.  A speculum was placed in the vagina and the cervix dilated with pratt dilators.  The 12 cm Rumi and 3 cm Koh ring were assembled and placed in proper fashion.  The  Speculum was removed and the bladder catheterized with a foley.    Attention was turned to the abdomen where a 1 cm incision was made 1 cm above the umbilicus.  The veress needle was introduced  without aspiration of bowel contents or blood and the abdomen insufflated. The long 12 mm trocar was placed and the other three trocar sites were marked out, all approximately 10 cm from each other and the camera.  Two 8.5 mm trocars were placed on either side of the camera port and a 5 mm assistant port was placed 3 cm above the L iliac crest.  All trocars were inserted under direct visualization of the camera.  The patient was placed in trendelenburg and then the Robot docked.  The PK forceps were placed on arm 2 and the Hot shears on arm 1 and introduced under direct visualization of the camera.  I then broke scrub and sat down at the console.  The above findings were noted and the ureters identified well out of the field of dissection.  The right fallopian tube was isolated and cauterized with the PK.  The Utero-ovarian ligament was then divided with the PK cautery and shears.  The posterior broad ligament was then divided with the hot shears until the uterosacral ligament.  The Broad and cardinal ligaments were then cauterized against the cervix to the level of the Koh ring, securing the uterine artery.  Each pedicle was then incised with the shears.  The anterior leaf was then incised at the reflection of the vessico-uterine junction and the lateral bladder retracted inferiorly after the round ligament had been divided with the PK forceps.  The left tube was cauterized with the PK and divided with the shears;  then the  left utero-ovarian ligament divided with the PK forceps and the scissors.  The round ligament was divided as well and the posterior leaf of the broad ligament then divided with the hot shears. The broad and cardinal ligaments were then cauterized on the left in the same way.   At the level of the internal os, the uterine arteries were bilaterally cauterized with the PK.  The ureters were identified well out of the field of dissection.  .   The bladder was then able to be retracted inferiorly  and the vesico-uterine fascia was incised in the midline until the bladder was removed one cm below the Koh ring.  The hot shears then circumferentially incised the vagina at the level of the reflection on the [facility] ring.  Once the uterus and cervix were amputated, cautery was used to insure hemostasis of the cuff.  Once hemostasis was achieved, the instruments were changed to the long forceps and mega suture cut needle driver and the cuff was closed with a running stitches of 0-vicryl V loc.  Cautery was used to ensure hemostasis of the left pedicles very superficially.  The ureters were peristalsing bilaterally well and very lateral to the areas of operation.    The Robot was then undocked and I scrubbed back in.    The skin incisions were closed with subcuticular stitches of 3-0 vicryl Rapide and Dermabond.  All instruments were removed from the vagina and cystoscopy performed, revealing an intact bladder and vigourous spill of indigo carmine from each ureteral orifice.  The cystoscope was removed and the patient taken to the recovery room in stable condition.  Sadler Teschner A

## 2011-12-30 NOTE — Preoperative (Signed)
Beta Blockers   Reason not to administer Beta Blockers:Not Applicable 

## 2011-12-30 NOTE — Anesthesia Postprocedure Evaluation (Signed)
  Anesthesia Post-op Note  Patient: Rachael Howard  Procedure(s) Performed: Procedure(s) (LRB): ROBOTIC ASSISTED TOTAL HYSTERECTOMY (N/A) ROBOTIC ASSISTED BILATERAL SALPINGO OOPHERECTOMY (Bilateral) CYSTOSCOPY (N/A) Patient is awake and responsive. Pain and nausea are reasonably well controlled. Vital signs are stable and clinically acceptable. Oxygen saturation is clinically acceptable. There are no apparent anesthetic complications at this time. Patient is ready for discharge.

## 2011-12-30 NOTE — Progress Notes (Signed)
There has been no change in the patients history, status or exam since the history and physical.  Filed Vitals:   12/30/11 1016  BP: 129/92  Pulse: 80  Temp: 98.2 F (36.8 C)  TempSrc: Oral  Resp: 16  SpO2: 100%    Lab Results  Component Value Date   WBC 9.1 12/22/2011   HGB 14.6 12/22/2011   HCT 42.6 12/22/2011   MCV 88.6 12/22/2011   PLT 381 12/22/2011   I reviewed the R/BAlt of ovarian removal at this age with the patient and she restated that she strongly desires BSO for management of endometriosis. Lockie Bothun A

## 2011-12-30 NOTE — Anesthesia Preprocedure Evaluation (Signed)
Anesthesia Evaluation  Patient identified by MRN, date of birth, ID band Patient awake    Reviewed: Allergy & Precautions, H&P , NPO status , Patient's Chart, lab work & pertinent test results, reviewed documented beta blocker date and time   History of Anesthesia Complications Negative for: history of anesthetic complications  Airway Mallampati: I TM Distance: >3 FB Neck ROM: full    Dental  (+) Poor Dentition   Pulmonary Current Smoker (1 ppd),  breath sounds clear to auscultation  Pulmonary exam normal       Cardiovascular Exercise Tolerance: Good negative cardio ROS  Rhythm:regular Rate:Normal     Neuro/Psych negative neurological ROS  negative psych ROS   GI/Hepatic negative GI ROS, Neg liver ROS,   Endo/Other  negative endocrine ROS  Renal/GU negative Renal ROS  Female GU complaint (endometriosis, takes tramadol when working)     Musculoskeletal   Abdominal   Peds  Hematology negative hematology ROS (+)   Anesthesia Other Findings   Reproductive/Obstetrics negative OB ROS                           Anesthesia Physical Anesthesia Plan  ASA: II  Anesthesia Plan: General ETT   Post-op Pain Management:    Induction:   Airway Management Planned:   Additional Equipment:   Intra-op Plan:   Post-operative Plan:   Informed Consent: I have reviewed the patients History and Physical, chart, labs and discussed the procedure including the risks, benefits and alternatives for the proposed anesthesia with the patient or authorized representative who has indicated his/her understanding and acceptance.   Dental Advisory Given  Plan Discussed with: CRNA and Surgeon  Anesthesia Plan Comments:         Anesthesia Quick Evaluation

## 2011-12-30 NOTE — Brief Op Note (Addendum)
12/30/2011  2:29 PM  PATIENT:  Rachael Howard  31 y.o. female  PRE-OPERATIVE DIAGNOSIS:  Endometriosis, severe  POST-OPERATIVE DIAGNOSIS:  Endometriosis, severe  PROCEDURE:  Procedure(s) (LRB): ROBOTIC ASSISTED TOTAL HYSTERECTOMY (N/A) ROBOTIC ASSISTED BILATERAL SALPINGO OOPHERECTOMY (Bilateral) CYSTOSCOPY (N/A)  SURGEON:  Surgeon(s) and Role:    * Trishelle Devora A Garnett Nunziata, MD - Primary    * Sidney Callahan, DO - Assisting  ANESTHESIA:   general  EBL:  Total I/O In: 600 [I.V.:600] Out: 200 [Urine:100; Blood:100]  SPECIMEN:  Source of Specimen:  uterus, cervix, ovaries and tubes  DISPOSITION OF SPECIMEN:  PATHOLOGY  COUNTS:  YES  PLAN OF CARE: Admit for overnight observation  PATIENT DISPOSITION:  PACU - hemodynamically stable.   Delay start of Pharmacological VTE agent (>24hrs) due to surgical blood loss or risk of bleeding: not applicable  Complications:  None.  Findings:  8 weeks size uterus with multiple clear plaques of endometriosis on serosal surface.  Residual sutures from interceed were seen over L uterosacral.  A small but deep masterson defect was seen in the right cul de sac. The appendix and liver edge were normal. Ovaries appeared normal.  The ureters were identified during multiple points of the case and were always out of the field of dissection.  On cystoscopy, the bladder was intact and bilateral spill was seen from each ureteral orriface.    Medications:  Ancef.  Indigo carmine.    Technique:  After adequate anesthesia was achieved the patient was positioned, prepped and draped in usual sterile fashion.  A speculum was placed in the vagina and the cervix dilated with pratt dilators.  The 12 cm Rumi and 3 cm Koh ring were assembled and placed in proper fashion.  The  Speculum was removed and the bladder catheterized with a foley.    Attention was turned to the abdomen where a 1 cm incision was made 1 cm above the umbilicus.  The veress needle was introduced  without aspiration of bowel contents or blood and the abdomen insufflated. The long 12 mm trocar was placed and the other three trocar sites were marked out, all approximately 10 cm from each other and the camera.  Two 8.5 mm trocars were placed on either side of the camera port and a 5 mm assistant port was placed 3 cm above the L iliac crest.  All trocars were inserted under direct visualization of the camera.  The patient was placed in trendelenburg and then the Robot docked.  The PK forceps were placed on arm 2 and the Hot shears on arm 1 and introduced under direct visualization of the camera.  I then broke scrub and sat down at the console.  The above findings were noted and the ureters identified well out of the field of dissection.  The right fallopian tube was isolated and cauterized with the PK.  The Utero-ovarian ligament was then divided with the PK cautery and shears.  The posterior broad ligament was then divided with the hot shears until the uterosacral ligament.  The Broad and cardinal ligaments were then cauterized against the cervix to the level of the Koh ring, securing the uterine artery.  Each pedicle was then incised with the shears.  The anterior leaf was then incised at the reflection of the vessico-uterine junction and the lateral bladder retracted inferiorly after the round ligament had been divided with the PK forceps.  The left tube was cauterized with the PK and divided with the shears;  then the   left utero-ovarian ligament divided with the PK forceps and the scissors.  The round ligament was divided as well and the posterior leaf of the broad ligament then divided with the hot shears. The broad and cardinal ligaments were then cauterized on the left in the same way.   At the level of the internal os, the uterine arteries were bilaterally cauterized with the PK.  The ureters were identified well out of the field of dissection.  .   The bladder was then able to be retracted inferiorly  and the vesico-uterine fascia was incised in the midline until the bladder was removed one cm below the Koh ring.  The hot shears then circumferentially incised the vagina at the level of the reflection on the Koh ring.  Once the uterus and cervix were amputated, cautery was used to insure hemostasis of the cuff.  Once hemostasis was achieved, the instruments were changed to the long forceps and mega suture cut needle driver and the cuff was closed with a running stitches of 0-vicryl V loc.  Cautery was used to ensure hemostasis of the left pedicles very superficially.  The ureters were peristalsing bilaterally well and very lateral to the areas of operation.    The Robot was then undocked and I scrubbed back in.    The skin incisions were closed with subcuticular stitches of 3-0 vicryl Rapide and Dermabond.  All instruments were removed from the vagina and cystoscopy performed, revealing an intact bladder and vigourous spill of indigo carmine from each ureteral orifice.  The cystoscope was removed and the patient taken to the recovery room in stable condition.  Falcon Mccaskey A     

## 2011-12-31 ENCOUNTER — Encounter (HOSPITAL_COMMUNITY): Payer: Self-pay | Admitting: Obstetrics and Gynecology

## 2011-12-31 LAB — COMPREHENSIVE METABOLIC PANEL
Alkaline Phosphatase: 77 U/L (ref 39–117)
BUN: 9 mg/dL (ref 6–23)
GFR calc Af Amer: >90 mL/min (ref >90)
Glucose, Bld: 139 mg/dL — ABNORMAL HIGH (ref 70–99)
Potassium: 3.8 mEq/L (ref 3.5–5.1)
Total Bilirubin: 0.5 mg/dL (ref 0.3–1.2)
Total Protein: 6.2 g/dL (ref 6.0–8.3)

## 2011-12-31 LAB — CBC
HCT: 35.9 % — ABNORMAL LOW (ref 36.0–46.0)
Hemoglobin: 12 g/dL (ref 12.0–15.0)
MCH: 29.3 pg (ref 26.0–34.0)
MCHC: 33.4 g/dL (ref 30.0–36.0)
MCV: 87.6 fL (ref 78.0–100.0)

## 2011-12-31 MED ORDER — IBUPROFEN 800 MG PO TABS: 800.0000 mg | ORAL_TABLET | Freq: Three times a day (TID) | ORAL | Status: AC | PRN

## 2011-12-31 MED ORDER — ESOMEPRAZOLE MAGNESIUM 20 MG PO CPDR: 20.0000 mg | DELAYED_RELEASE_CAPSULE | Freq: Every day | ORAL | Status: DC

## 2011-12-31 MED ORDER — HYDROCODONE-ACETAMINOPHEN 5-500 MG PO TABS: 1.0000 | ORAL_TABLET | Freq: Four times a day (QID) | ORAL | Status: AC | PRN

## 2011-12-31 NOTE — Progress Notes (Signed)
D/c home teaching complete  Ambulated out

## 2011-12-31 NOTE — Progress Notes (Signed)
Patient is eating, ambulating, and voiding.  Pain control is good.  BP 95/63  Pulse 69  Temp(Src) 97.9 F (36.6 C) (Oral)  Resp 16  Ht 5\' 3"  (1.6 m)  Wt 63.504 kg (140 lb)  BMI 24.80 kg/m2  SpO2 98%  lungs:   clear to auscultation cor:    RRR Abdomen:  soft, appropriate tenderness, incisions intact and without erythema or exudate. ex:    no cords   Lab Results  Component Value Date   WBC 15.7* 12/31/2011   HGB 12.0 12/31/2011   HCT 35.9* 12/31/2011   MCV 87.6 12/31/2011   PLT 353 12/31/2011    A/P  Routine care.  Expect d/c per plan.

## 2011-12-31 NOTE — Discharge Summary (Signed)
Physician Discharge Summary  Patient ID: Rachael Howard MRN: [id number] DOB/AGE: [birth year] 30 y.o.  Admit date: 12/30/2011 Discharge date: 12/31/2011  Admission Diagnoses: severe endometriosis  Discharge Diagnoses: same Active Problems:  * No active hospital problems. *    Discharged Condition: good  Hospital Course: Uncomplicated surgery and post op course.  Consults: None  Significant Diagnostic Studies: labs:  Results for orders placed during the hospital encounter of 12/30/11 (from the past 24 hour(s))  PREGNANCY, URINE     Status: Normal   Collection Time   12/30/11  9:07 AM      Component Value Range   Preg Test, Ur NEGATIVE  NEGATIVE   CBC     Status: Abnormal   Collection Time   12/31/11  5:22 AM      Component Value Range   WBC 15.7 (*) 4.0 - 10.5 (K/uL)   RBC 4.10  3.87 - 5.11 (MIL/uL)   Hemoglobin 12.0  12.0 - 15.0 (g/dL)   HCT 62.9 (*) 52.8 - 46.0 (%)   MCV 87.6  78.0 - 100.0 (fL)   MCH 29.3  26.0 - 34.0 (pg)   MCHC 33.4  30.0 - 36.0 (g/dL)   RDW 41.3  24.4 - 01.0 (%)   Platelets 353  150 - 400 (K/uL)  COMPREHENSIVE METABOLIC PANEL     Status: Abnormal   Collection Time   12/31/11  5:22 AM      Component Value Range   Sodium 136  135 - 145 (mEq/L)   Potassium 3.8  3.5 - 5.1 (mEq/L)   Chloride 102  96 - 112 (mEq/L)   CO2 26  19 - 32 (mEq/L)   Glucose, Bld 139 (*) 70 - 99 (mg/dL)   BUN 9  6 - 23 (mg/dL)   Creatinine, Ser 2.72  0.50 - 1.10 (mg/dL)   Calcium 8.8  8.4 - 53.6 (mg/dL)   Total Protein 6.2  6.0 - 8.3 (g/dL)   Albumin 3.3 (*) 3.5 - 5.2 (g/dL)   AST 13  0 - 37 (U/L)   ALT 8  0 - 35 (U/L)   Alkaline Phosphatase 77  39 - 117 (U/L)   Total Bilirubin 0.5  0.3 - 1.2 (mg/dL)   GFR calc non Af Amer >90  >90 (mL/min)   GFR calc Af Amer >90  >90 (mL/min)    Treatments: surgery: Robotic TLH, BSO, cystoscopy  Discharge Exam: Blood pressure 95/63, pulse 69, temperature 97.9 F (36.6 C), temperature source Oral, resp. rate 16, height 5\' 3"  (1.6 m),  weight 63.504 kg (140 lb), SpO2 98.00%.   Disposition: 01-Home or Self Care  Discharge Orders    Future Orders Please Complete By Expires   Diet - low sodium heart healthy      Discharge instructions      Comments:   No driving on narcotics, no sexual activity for 2 weeks.   Increase activity slowly      May shower / Bathe      Comments:   Shower, no bath for 2 weeks.   Sexual Activity Restrictions      Comments:   No sexual activity for 2 weeks.   Remove dressing in 24 hours      Call MD for:  temperature >100.4        Medication List  As of 12/31/2011  7:39 AM   TAKE these medications         BEE POLLEN PO   Take 1 capsule by mouth 2 (  two) times daily.      calcium-vitamin D 250-125 MG-UNIT per tablet   Commonly known as: OSCAL   Take 1 tablet by mouth daily.      HYDROcodone-acetaminophen 5-500 MG per tablet   Commonly known as: VICODIN   Take 1 tablet by mouth every 6 (six) hours as needed for pain.      ibuprofen 800 MG tablet   Commonly known as: ADVIL,MOTRIN   Take 1 tablet (800 mg total) by mouth every 8 (eight) hours as needed (mild pain).      omeprazole 20 MG tablet   Commonly known as: PRILOSEC OTC   Take 20 mg by mouth daily. Stopped 4/17      progesterone 100 MG capsule   Commonly known as: PROMETRIUM   Take 100 mg by mouth daily.      traMADol 50 MG tablet   Commonly known as: ULTRAM   Take 50 mg by mouth daily as needed. When she works for pain.      valACYclovir 500 MG tablet   Commonly known as: VALTREX   Take 500 mg by mouth daily.      Vitamin D 1000 UNITS capsule   Take 1,000 Units by mouth daily.           Follow-up Information    Schedule an appointment as soon as possible for a visit with Zulma Court A, MD.   Contact information:   [address] [place] [address] [phone]          Signed: Loney Laurence 12/31/2011, 7:39 AM

## 2011-12-31 NOTE — Plan of Care (Signed)
Problem: Phase II Progression Outcomes Goal: Foley discontinued Outcome: Completed/Met Date Met:  12/31/11 dc'd in PACU

## 2011-12-31 NOTE — Addendum Note (Signed)
Addendum  created 12/31/11 1041 by Shanon Payor, CRNA   Modules edited:Notes Section

## 2011-12-31 NOTE — Anesthesia Postprocedure Evaluation (Signed)
  Anesthesia Post-op Note  Patient: Rachael Howard  Procedure(s) Performed: Procedure(s) (LRB): ROBOTIC ASSISTED TOTAL HYSTERECTOMY (N/A) ROBOTIC ASSISTED BILATERAL SALPINGO OOPHERECTOMY (Bilateral) CYSTOSCOPY (N/A)  Patient Location: [facility]  Anesthesia Type: General  Level of Consciousness: awake, alert  and oriented  Airway and Oxygen Therapy: Patient Spontanous Breathing  Post-op Pain: none  Post-op Assessment: Post-op Vital signs reviewed and Patient's Cardiovascular Status Stable  Post-op Vital Signs: Reviewed and stable  Complications: No apparent anesthesia complications

## 2013-07-04 ENCOUNTER — Emergency Department (HOSPITAL_BASED_OUTPATIENT_CLINIC_OR_DEPARTMENT_OTHER)
Admission: EM | Admit: 2013-07-04 | Discharge: 2013-07-04 | Disposition: A | Discharge status: Home / Self Care | Payer: BC Managed Care – PPO | Attending: Emergency Medicine | Admitting: Emergency Medicine

## 2013-07-04 ENCOUNTER — Encounter (HOSPITAL_BASED_OUTPATIENT_CLINIC_OR_DEPARTMENT_OTHER): Payer: Self-pay | Admitting: Emergency Medicine

## 2013-07-04 DIAGNOSIS — Z880 Allergy status to penicillin: Secondary | ICD-10-CM | POA: Insufficient documentation

## 2013-07-04 DIAGNOSIS — Z79899 Other long term (current) drug therapy: Secondary | ICD-10-CM | POA: Insufficient documentation

## 2013-07-04 DIAGNOSIS — J029 Acute pharyngitis, unspecified: Secondary | ICD-10-CM | POA: Insufficient documentation

## 2013-07-04 DIAGNOSIS — F17200 Nicotine dependence, unspecified, uncomplicated: Secondary | ICD-10-CM | POA: Insufficient documentation

## 2013-07-04 LAB — RAPID STREP SCREEN (MED CTR MEBANE ONLY): Streptococcus, Group A Screen (Direct): NEGATIVE

## 2013-07-04 MED ORDER — NAPROXEN 500 MG PO TABS: 500.0000 mg | ORAL_TABLET | Freq: Two times a day (BID) | ORAL | Status: DC

## 2013-07-04 NOTE — ED Notes (Signed)
Sore throat since last night. She thinks she has strep throat.

## 2013-07-04 NOTE — ED Provider Notes (Signed)
CSN: [id number]     Arrival date & time 07/04/13  1446 History   First MD Initiated Contact with Patient 07/04/13 1541     Chief Complaint  Patient presents with  . Sore Throat    Patient is a 32 y.o. female presenting with pharyngitis. The history is provided by the patient.  Sore Throat This is a new problem. The current episode started yesterday. The problem occurs constantly. The problem has not changed since onset.Pertinent negatives include no chest pain, no abdominal pain, no headaches and no shortness of breath. The symptoms are aggravated by swallowing. Nothing relieves the symptoms. She has tried nothing for the symptoms.  She does feel like food gets caught a bit when she swallows. No trouble eating or drinking well. No difficulty with her saliva. All the symptoms started just yesterday. No runny nose cough or congestion  Past Medical History  Diagnosis Date  . No pertinent past medical history    Past Surgical History  Procedure Laterality Date  . Cesarean section  2006  . Dilation and curettage of uterus    . Cystoscopy  12/30/2011    Procedure: CYSTOSCOPY;  Surgeon: Loney Laurence, MD;  Location: [facility];  Service: Gynecology;  Laterality: N/A;   No family history on file. History  Substance Use Topics  . Smoking status: Current Every Day Smoker -- 1.00 packs/day    Types: Cigarettes  . Smokeless tobacco: Not on file  . Alcohol Use: No   OB History   Grav Para Term Preterm Abortions TAB SAB Ect Mult Living                 Review of Systems  Respiratory: Negative for shortness of breath.   Cardiovascular: Negative for chest pain.  Gastrointestinal: Negative for abdominal pain.  Neurological: Negative for headaches.  All other systems reviewed and are negative.    Allergies  Erythromycin; Erythrocin; Oxycodone-acetaminophen; and Penicillins  Home Medications   Current Outpatient Rx  Name  Route  Sig  Dispense  Refill  . BEE POLLEN PO   Oral   Take 1  capsule by mouth 2 (two) times daily.         . calcium-vitamin D (OSCAL) 250-125 MG-UNIT per tablet   Oral   Take 1 tablet by mouth daily.          . Cholecalciferol (VITAMIN D) 1000 UNITS capsule   Oral   Take 1,000 Units by mouth daily.         Marland Kitchen EXPIRED: esomeprazole (NEXIUM) 20 MG capsule   Oral   Take 1 capsule (20 mg total) by mouth daily before breakfast.   90 capsule   1   . naproxen (NAPROSYN) 500 MG tablet   Oral   Take 1 tablet (500 mg total) by mouth 2 (two) times daily with a meal. As needed for pain   20 tablet   0   . omeprazole (PRILOSEC OTC) 20 MG tablet   Oral   Take 20 mg by mouth daily. Stopped 4/17         . progesterone (PROMETRIUM) 100 MG capsule   Oral   Take 100 mg by mouth daily.         . traMADol (ULTRAM) 50 MG tablet   Oral   Take 50 mg by mouth daily as needed. When she works for pain.         . valACYclovir (VALTREX) 500 MG tablet   Oral   Take  500 mg by mouth daily.           BP 135/91  Pulse 106  Temp(Src) 98.8 F (37.1 C) (Oral)  Resp 16  Ht 5\' 4"  (1.626 m)  Wt 160 lb (72.576 kg)  BMI 27.45 kg/m2  SpO2 99% Physical Exam  Nursing note and vitals reviewed. Constitutional: She appears well-developed and well-nourished. No distress.  HENT:  Head: Normocephalic and atraumatic.  Right Ear: External ear normal.  Left Ear: External ear normal.  Mouth/Throat: Oropharynx is clear and moist. No oropharyngeal exudate (small amount of debris noted in the right tonsillar crypt).  Eyes: Conjunctivae are normal. Right eye exhibits no discharge. Left eye exhibits no discharge. No scleral icterus.  Neck: Neck supple. No tracheal deviation present.  Cardiovascular: Normal rate.   Pulmonary/Chest: Effort normal. No stridor. No respiratory distress.  Musculoskeletal: She exhibits no edema.  Lymphadenopathy:    She has no cervical adenopathy.  Neurological: She is alert. Cranial nerve deficit: no gross deficits.  Skin: Skin is  warm and dry. No rash noted.  Psychiatric: She has a normal mood and affect.    ED Course  Procedures (including critical care time) Labs Review Labs Reviewed  RAPID STREP SCREEN  CULTURE, GROUP A STREP   Imaging Review No results found.  EKG Interpretation   None       MDM   1. Viral pharyngitis    Most likely a viral pharyngitis. I recommend follow up with her primary Dr. if not getting better within a week.  No evidence of abscess or obstruction  Celene Kras, MD 07/04/13 1553

## 2013-07-06 LAB — CULTURE, GROUP A STREP

## 2014-01-24 ENCOUNTER — Other Ambulatory Visit: Payer: Self-pay | Admitting: Obstetrics and Gynecology

## 2014-01-25 LAB — CYTOLOGY - PAP

## 2014-02-21 DIAGNOSIS — C801 Malignant (primary) neoplasm, unspecified: Secondary | ICD-10-CM

## 2014-02-21 HISTORY — DX: Malignant (primary) neoplasm, unspecified: C80.1

## 2014-03-01 ENCOUNTER — Emergency Department (HOSPITAL_BASED_OUTPATIENT_CLINIC_OR_DEPARTMENT_OTHER): Payer: 59

## 2014-03-01 ENCOUNTER — Encounter (HOSPITAL_BASED_OUTPATIENT_CLINIC_OR_DEPARTMENT_OTHER): Payer: Self-pay | Admitting: Emergency Medicine

## 2014-03-01 ENCOUNTER — Emergency Department (HOSPITAL_BASED_OUTPATIENT_CLINIC_OR_DEPARTMENT_OTHER)
Admission: EM | Admit: 2014-03-01 | Discharge: 2014-03-01 | Disposition: A | Discharge status: Other Acute Inpatient Hospital | Payer: 59 | Attending: Emergency Medicine | Admitting: Emergency Medicine

## 2014-03-01 DIAGNOSIS — F17200 Nicotine dependence, unspecified, uncomplicated: Secondary | ICD-10-CM | POA: Insufficient documentation

## 2014-03-01 DIAGNOSIS — R000 Tachycardia, unspecified: Secondary | ICD-10-CM | POA: Diagnosis not present

## 2014-03-01 DIAGNOSIS — R61 Generalized hyperhidrosis: Secondary | ICD-10-CM | POA: Insufficient documentation

## 2014-03-01 DIAGNOSIS — R079 Chest pain, unspecified: Secondary | ICD-10-CM

## 2014-03-01 DIAGNOSIS — Z8544 Personal history of malignant neoplasm of other female genital organs: Secondary | ICD-10-CM | POA: Insufficient documentation

## 2014-03-01 DIAGNOSIS — R0602 Shortness of breath: Secondary | ICD-10-CM | POA: Insufficient documentation

## 2014-03-01 DIAGNOSIS — Z79899 Other long term (current) drug therapy: Secondary | ICD-10-CM | POA: Insufficient documentation

## 2014-03-01 HISTORY — DX: Malignant (primary) neoplasm, unspecified: C80.1

## 2014-03-01 LAB — CBC WITH DIFFERENTIAL/PLATELET
BASOS ABS: 0 10*3/uL (ref 0.0–0.1)
BASOS PCT: 0 % (ref 0–1)
Eosinophils Absolute: 0.1 10*3/uL (ref 0.0–0.7)
Eosinophils Relative: 1 % (ref 0–5)
HEMATOCRIT: 40.4 % (ref 36.0–46.0)
Hemoglobin: 13.8 g/dL (ref 12.0–15.0)
Lymphocytes Relative: 18 % (ref 12–46)
Lymphs Abs: 1.7 10*3/uL (ref 0.7–4.0)
MCH: 29.1 pg (ref 26.0–34.0)
MCHC: 34.2 g/dL (ref 30.0–36.0)
MCV: 85.2 fL (ref 78.0–100.0)
MONO ABS: 0.7 10*3/uL (ref 0.1–1.0)
Monocytes Relative: 7 % (ref 3–12)
Neutro Abs: 6.8 10*3/uL (ref 1.7–7.7)
Neutrophils Relative %: 74 % (ref 43–77)
Platelets: 376 10*3/uL (ref 150–400)
RBC: 4.74 MIL/uL (ref 3.87–5.11)
RDW: 13.7 % (ref 11.5–15.5)
WBC: 9.2 10*3/uL (ref 4.0–10.5)

## 2014-03-01 LAB — COMPREHENSIVE METABOLIC PANEL
ALBUMIN: 3.9 g/dL (ref 3.5–5.2)
ALT: 32 U/L (ref 0–35)
AST: 30 U/L (ref 0–37)
Alkaline Phosphatase: 124 U/L — ABNORMAL HIGH (ref 39–117)
Anion gap: 15 (ref 5–15)
BILIRUBIN TOTAL: 0.4 mg/dL (ref 0.3–1.2)
BUN: 15 mg/dL (ref 6–23)
CALCIUM: 9.6 mg/dL (ref 8.4–10.5)
CHLORIDE: 100 meq/L (ref 96–112)
CO2: 24 mEq/L (ref 19–32)
CREATININE: 0.8 mg/dL (ref 0.50–1.10)
GFR calc Af Amer: >90 mL/min (ref >90)
GFR calc non Af Amer: >90 mL/min (ref >90)
Glucose, Bld: 104 mg/dL — ABNORMAL HIGH (ref 70–99)
Potassium: 4.4 mEq/L (ref 3.7–5.3)
SODIUM: 139 meq/L (ref 137–147)
Total Protein: 7.7 g/dL (ref 6.0–8.3)

## 2014-03-01 LAB — TROPONIN I: Troponin I: <0.3 ng/mL (ref <0.30)

## 2014-03-01 MED ORDER — IOHEXOL 300 MG/ML  SOLN: 100.0000 mL | Freq: Once | INTRAMUSCULAR | Status: DC | PRN

## 2014-03-01 MED ORDER — NITROGLYCERIN 2 % TD OINT
1.0000 [in_us] | TOPICAL_OINTMENT | Freq: Four times a day (QID) | TRANSDERMAL | Status: DC
  Administered 2014-03-01: 1 [in_us] via TOPICAL
  Filled 2014-03-01: qty 1

## 2014-03-01 MED ORDER — ACETAMINOPHEN 325 MG PO TABS: 650.0000 mg | ORAL_TABLET | Freq: Once | ORAL | Status: DC

## 2014-03-01 MED ORDER — IOHEXOL 350 MG/ML SOLN
100.0000 mL | Freq: Once | INTRAVENOUS | Status: AC | PRN
  Administered 2014-03-01: 100 mL via INTRAVENOUS

## 2014-03-01 MED ORDER — MORPHINE SULFATE 2 MG/ML IJ SOLN
2.0000 mg | Freq: Once | INTRAMUSCULAR | Status: AC
  Administered 2014-03-01: 2 mg via INTRAVENOUS
  Filled 2014-03-01: qty 1

## 2014-03-01 MED ORDER — ACETAMINOPHEN 500 MG PO TABS: 1000.0000 mg | ORAL_TABLET | Freq: Once | ORAL | Status: DC

## 2014-03-01 MED ORDER — SODIUM CHLORIDE 0.9 % IV SOLN
Freq: Once | INTRAVENOUS | Status: AC
  Administered 2014-03-01: 09:00:00 via INTRAVENOUS

## 2014-03-01 MED ORDER — ACETAMINOPHEN 500 MG PO TABS
ORAL_TABLET | ORAL | Status: AC
  Administered 2014-03-01: 1000 mg
  Filled 2014-03-01: qty 2

## 2014-03-01 MED ORDER — NITROGLYCERIN 0.4 MG SL SUBL
0.4000 mg | SUBLINGUAL_TABLET | SUBLINGUAL | Status: DC | PRN
  Administered 2014-03-01: 0.4 mg via SUBLINGUAL
  Filled 2014-03-01: qty 1

## 2014-03-01 MED ORDER — ASPIRIN 81 MG PO CHEW
324.0000 mg | CHEWABLE_TABLET | Freq: Once | ORAL | Status: AC
  Administered 2014-03-01: 324 mg via ORAL
  Filled 2014-03-01: qty 4

## 2014-03-01 NOTE — ED Notes (Signed)
[facility] at bedside for transport.

## 2014-03-01 NOTE — ED Notes (Signed)
MD at bedside. 

## 2014-03-01 NOTE — ED Notes (Signed)
Spoke with [employer] stated that she would call when a bed assignment was ready for the patient

## 2014-03-01 NOTE — ED Provider Notes (Signed)
CSN: [id number]     Arrival date & time 03/01/14  0831 History   First MD Initiated Contact with Patient 03/01/14 [phone]     Chief Complaint  Patient presents with  . Chest Pain     (Consider location/radiation/quality/duration/timing/severity/associated sxs/prior Treatment) HPI Comments: Patient presents to the ER for evaluation of chest pain. Patient reports that she was not feeling well when she got up, but went to work anyway. When she got to work she started having a heaviness and pressure over the left chest. She has had diaphoresis associated with this pain. There is also shortness of breath. She feels like she can't catch her breath.  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 She has not started any treatments.  Patient is a former smoker, quit 7 months ago. She has no history of diabetes, hypertension, high cholesterol. She does have significant family history of heart disease, however. Her mother had her first MI at age 36 or 51.  Patient is a 33 y.o. female presenting with chest pain.  Chest Pain Associated symptoms: diaphoresis and shortness of breath     Past Medical History  Diagnosis Date  . No pertinent past medical history   . Cancer 02/2014    vaginal   Past Surgical History  Procedure Laterality Date  . Cesarean section  2006  . Dilation and curettage of uterus    . Cystoscopy  12/30/2011    Procedure: CYSTOSCOPY;  Surgeon: Daria Pastures, MD;  Location: [facility];  Service: Gynecology;  Laterality: N/A;  . Abdominal hysterectomy     No family history on file. History  Substance Use Topics  . Smoking status: Current Every Day Smoker -- 1.00 packs/day    Types: Cigarettes  . Smokeless tobacco: Not on file  . Alcohol Use: No   OB History   Grav Para Term Preterm Abortions TAB SAB Ect Mult Living                 Review of Systems  Constitutional: Positive for diaphoresis.  Respiratory: Positive for shortness of breath.   Cardiovascular:  Positive for chest pain.  All other systems reviewed and are negative.     Allergies  Erythromycin; Erythrocin; Oxycodone-acetaminophen; and Penicillins  Home Medications   Prior to Admission medications   Medication Sig Start Date End Date Taking? Authorizing Provider  estradiol (ESTRACE) 1 MG tablet Take 1 mg by mouth daily.   Yes Historical Provider, MD  valACYclovir (VALTREX) 500 MG tablet Take 500 mg by mouth daily.     Historical Provider, MD   BP 127/80  Pulse 94  Temp(Src) 98.4 F (36.9 C) (Oral)  Resp 18  Ht 5\' 4"  (1.626 m)  Wt 150 lb (68.04 kg)  BMI 25.73 kg/m2  SpO2 100% Physical Exam  Constitutional: She is oriented to person, place, and time. She appears well-developed and well-nourished. No distress.  HENT:  Head: Normocephalic and atraumatic.  Right Ear: Hearing normal.  Left Ear: Hearing normal.  Nose: Nose normal.  Mouth/Throat: Oropharynx is clear and moist and mucous membranes are normal.  Eyes: Conjunctivae and EOM are normal. Pupils are equal, round, and reactive to light.  Neck: Normal range of motion. Neck supple.  Cardiovascular: Regular rhythm, S1 normal and S2 normal.  Tachycardia present.  Exam reveals no gallop and no friction rub.   No murmur heard. Pulmonary/Chest: Effort normal and breath sounds normal. No respiratory distress. She exhibits no tenderness.  Abdominal: Soft. Normal appearance  and bowel sounds are normal. There is no hepatosplenomegaly. There is no tenderness. There is no rebound, no guarding, no tenderness at McBurney's point and negative Murphy's sign. No hernia.  Musculoskeletal: Normal range of motion.  Neurological: She is alert and oriented to person, place, and time. She has normal strength. No cranial nerve deficit or sensory deficit. Coordination normal. GCS eye subscore is 4. GCS verbal subscore is 5. GCS motor subscore is 6.  Skin: Skin is warm, dry and intact. No rash noted. No cyanosis.  Psychiatric: She has a normal  mood and affect. Her speech is normal and behavior is normal. Thought content normal.    ED Course  Procedures (including critical care time) Labs Review Labs Reviewed  COMPREHENSIVE METABOLIC PANEL - Abnormal; Notable for the following:    Glucose, Bld 104 (*)    Alkaline Phosphatase 124 (*)    All other components within normal limits  CBC WITH DIFFERENTIAL  TROPONIN I    Imaging Review Dg Chest 2 View  03/01/2014   CLINICAL DATA:  Left-sided chest pain with shortness of breath.  EXAM: CHEST  2 VIEW  COMPARISON:  11/03/2009  FINDINGS: The heart size and mediastinal contours are within normal limits. Both lungs are clear. The visualized skeletal structures are unremarkable.  IMPRESSION: Stable.  No acute cardiopulmonary findings.   Electronically Signed   By: Misty Stanley M.D.   On: 03/01/2014 09:51   Ct Angio Chest Pe W/cm &/or Wo Cm  03/01/2014   CLINICAL DATA:  Chest pain and short of breath  EXAM: CT ANGIOGRAPHY CHEST WITH CONTRAST  TECHNIQUE: Multidetector CT imaging of the chest was performed using the standard protocol during bolus administration of intravenous contrast. Multiplanar CT image reconstructions and MIPs were obtained to evaluate the vascular anatomy.  CONTRAST:  160mL OMNIPAQUE IOHEXOL 350 MG/ML SOLN, 1 OMNIPAQUE IOHEXOL 300 MG/ML SOLN  COMPARISON:  Chest x-ray 03/01/2014  FINDINGS: Negative for pulmonary embolism. Negative for aortic aneurysm or dissection. Heart size upper normal. Negative for right heart strain.  Negative for mass or adenopathy. Lungs are clear. Negative for pneumonia or effusion. Upper abdomen is negative.  Review of the MIP images confirms the above findings.  IMPRESSION: Negative.   Electronically Signed   By: Franchot Gallo M.D.   On: 03/01/2014 11:04     EKG Interpretation   Date/Time:  Thursday March 01 2014 08:41:30 EDT Ventricular Rate:  104 PR Interval:  132 QRS Duration: 74 QT Interval:  332 QTC Calculation: 436 R Axis:   48 Text  Interpretation:  Sinus tachycardia Otherwise normal ECG Confirmed by  POLLINA  MD, CHRISTOPHER ([id number]) on 03/01/2014 8:54:41 AM      MDM   Final diagnoses:  Chest pain, unspecified chest pain type    Presented to the ER for evaluation of left-sided chest pain. Patient had onset of left-sided pain this morning that was accompanied by shortness of breath, diaphoresis and nausea. She arrived hypertensive, but reports that she has no history of hypertension. She did report a family history of heart disease, tells me that her mother had heart problems starting in her late 56s. Patient is also a smoker and has had a hysterectomy, on estrogen replacement.  The patient's chest pain and hypertension has resolved after nitroglycerin. The fact that she has had chest pain relieved by nitrates and has multiple cardiac risk factors, specifically early heart disease in female members of her family, makes me feel that the patient requires further evaluation. I did  consider PE, as she does report a history of cancer. CT angiography did not show any evidence of PE. Based on this, I recommended admission to the hospital for serial cardiac enzymes, further cardiac evaluation.  I did offer the patient the option of transfer to [facility], [facility], or [place]. She felt that [facility] is closest to her home and would like to be transferred there. Case was discussed briefly with Larae Grooms, PA for the hospitalist program in [facility] regional. The patient is stable for transfer at this time.  Orpah Greek, MD 03/01/14 1316

## 2014-03-01 NOTE — ED Notes (Signed)
Pt having left sided chest pain since this am.  Pt having sob, diaphoresis and nausea.

## 2014-11-29 ENCOUNTER — Other Ambulatory Visit: Payer: Self-pay | Admitting: Obstetrics and Gynecology

## 2014-11-30 LAB — CYTOLOGY - PAP

## 2015-02-13 ENCOUNTER — Other Ambulatory Visit: Payer: Self-pay | Admitting: Neurosurgery

## 2015-02-26 ENCOUNTER — Encounter (HOSPITAL_COMMUNITY)
Admission: RE | Admit: 2015-02-26 | Discharge: 2015-02-26 | Disposition: A | Discharge status: Home / Self Care | Payer: Medicaid Other | Source: Ambulatory Visit | Attending: Neurosurgery | Admitting: Neurosurgery

## 2015-02-26 ENCOUNTER — Encounter (HOSPITAL_COMMUNITY): Payer: Self-pay

## 2015-02-26 DIAGNOSIS — Z01812 Encounter for preprocedural laboratory examination: Secondary | ICD-10-CM | POA: Diagnosis present

## 2015-02-26 DIAGNOSIS — M5020 Other cervical disc displacement, unspecified cervical region: Secondary | ICD-10-CM | POA: Diagnosis not present

## 2015-02-26 HISTORY — DX: Major depressive disorder, single episode, unspecified: F32.9

## 2015-02-26 HISTORY — DX: Personal history of other diseases of the respiratory system: Z87.09

## 2015-02-26 HISTORY — DX: Gastro-esophageal reflux disease without esophagitis: K21.9

## 2015-02-26 HISTORY — DX: Anxiety disorder, unspecified: F41.9

## 2015-02-26 HISTORY — DX: Depression, unspecified: F32.A

## 2015-02-26 HISTORY — DX: Angina pectoris, unspecified: I20.9

## 2015-02-26 HISTORY — DX: Family history of other specified conditions: Z84.89

## 2015-02-26 HISTORY — DX: Headache, unspecified: R51.9

## 2015-02-26 HISTORY — DX: Headache: R51

## 2015-02-26 LAB — SURGICAL PCR SCREEN
MRSA, PCR: NEGATIVE
Staphylococcus aureus: NEGATIVE

## 2015-02-26 LAB — BASIC METABOLIC PANEL
Anion gap: 8 (ref 5–15)
BUN: 11 mg/dL (ref 6–20)
CO2: 26 mmol/L (ref 22–32)
Calcium: 9.6 mg/dL (ref 8.9–10.3)
Chloride: 102 mmol/L (ref 101–111)
Creatinine, Ser: 0.87 mg/dL (ref 0.44–1.00)
GFR calc Af Amer: >60 mL/min (ref >60)
GLUCOSE: 93 mg/dL (ref 65–99)
POTASSIUM: 4.7 mmol/L (ref 3.5–5.1)
Sodium: 136 mmol/L (ref 135–145)

## 2015-02-26 LAB — CBC
HEMATOCRIT: 41 % (ref 36.0–46.0)
HEMOGLOBIN: 14 g/dL (ref 12.0–15.0)
MCH: 28.5 pg (ref 26.0–34.0)
MCHC: 34.1 g/dL (ref 30.0–36.0)
MCV: 83.3 fL (ref 78.0–100.0)
Platelets: 372 10*3/uL (ref 150–400)
RBC: 4.92 MIL/uL (ref 3.87–5.11)
RDW: 13.2 % (ref 11.5–15.5)
WBC: 8.1 10*3/uL (ref 4.0–10.5)

## 2015-02-26 NOTE — Progress Notes (Signed)
PCP- pt. States that she currently does not have a PCP Cardiologist - denies EKG- Pt. States she had one in April 2016 at [facility] - requested CXR - 03/01/14 - Epic Echo- denies Stress test - denies Cardiac Cath - denies

## 2015-02-26 NOTE — Pre-Procedure Instructions (Signed)
    Vashon Arch Misenheimer  02/26/2015      [id number] - [place], [address] AT [facility] [address] [place] [address] Phone: [phone] Fax: [phone]  [facility], [address], [id number] [place] [address] Phone: [phone] Fax: [phone]    Your procedure is scheduled on Thursday, July 7th, at 11:00 AM.  Report to [place] at 09:00 A.M.  Call this number if you have problems the morning of surgery:  [phone]   Remember:  Do not eat food or drink liquids after midnight.   Take these medicines the morning of surgery with A SIP OF WATER: None.  Stop taking: NSAIDS, Aspirin, Ibuprofen, Aleve, Naproxen, BC's, Goody's, all herbal medications, and all vitamins.    Do not wear jewelry, make-up or nail polish.  Do not wear lotions, powders, or perfumes.  You may wear NOT deodorant.  Do not shave 48 hours prior to surgery.   Do not bring valuables to the hospital.  [facility] is not responsible for any belongings or valuables.  Contacts, dentures or bridgework may not be worn into surgery.  Leave your suitcase in the car.  After surgery it may be brought to your room.  For patients admitted to the hospital, discharge time will be determined by your treatment team.  Patients discharged the day of surgery will not be allowed to drive home.   Special instructions:  See attached.  Please read over the following fact sheets that you were given. Pain Booklet, Coughing and Deep Breathing, MRSA Information and Surgical Site Infection Prevention

## 2015-02-27 MED ORDER — VANCOMYCIN HCL 10 G IV SOLR
1500.0000 mg | INTRAVENOUS | Status: AC
  Administered 2015-02-28: 1500 mg via INTRAVENOUS
  Filled 2015-02-27: qty 1500

## 2015-02-27 NOTE — Anesthesia Preprocedure Evaluation (Addendum)
Anesthesia Evaluation  Patient identified by MRN, date of birth, ID band Patient awake    Reviewed: Allergy & Precautions, NPO status , Patient's Chart, lab work & pertinent test results, reviewed documented beta blocker date and time   Airway Mallampati: II   Neck ROM: Full    Dental  (+) Dental Advisory Given, Poor Dentition, Missing   Pulmonary former smoker (quit 2014),  breath sounds clear to auscultation        Cardiovascular Rhythm:Regular  EKG 2015 WNL   Neuro/Psych  Headaches, Anxiety Depression    GI/Hepatic Neg liver ROS, GERD-  Medicated,  Endo/Other  negative endocrine ROS  Renal/GU negative Renal ROS     Musculoskeletal   Abdominal (+)  Abdomen: soft.    Peds  Hematology 14/41   Anesthesia Other Findings   Reproductive/Obstetrics                            Anesthesia Physical Anesthesia Plan  ASA: II  Anesthesia Plan: General   Post-op Pain Management:    Induction: Intravenous  Airway Management Planned: Oral ETT  Additional Equipment:   Intra-op Plan:   Post-operative Plan: Extubation in OR  Informed Consent: I have reviewed the patients History and Physical, chart, labs and discussed the procedure including the risks, benefits and alternatives for the proposed anesthesia with the patient or authorized representative who has indicated his/her understanding and acceptance.     Plan Discussed with:   Anesthesia Plan Comments: (Multimodal pain RX)        Anesthesia Quick Evaluation

## 2015-02-28 ENCOUNTER — Ambulatory Visit (HOSPITAL_COMMUNITY): Payer: Medicaid Other

## 2015-02-28 ENCOUNTER — Encounter (HOSPITAL_COMMUNITY): Payer: Self-pay | Admitting: Certified Registered Nurse Anesthetist

## 2015-02-28 ENCOUNTER — Encounter (HOSPITAL_COMMUNITY)
Admission: RE | Disposition: A | Discharge status: Home / Self Care | Payer: Self-pay | Source: Ambulatory Visit | Attending: Neurosurgery

## 2015-02-28 ENCOUNTER — Ambulatory Visit (HOSPITAL_COMMUNITY): Payer: Medicaid Other | Admitting: Anesthesiology

## 2015-02-28 ENCOUNTER — Ambulatory Visit (HOSPITAL_COMMUNITY)
Admission: RE | Admit: 2015-02-28 | Discharge: 2015-02-28 | Disposition: A | Discharge status: Home / Self Care | Payer: Medicaid Other | Source: Ambulatory Visit | Attending: Neurosurgery | Admitting: Neurosurgery

## 2015-02-28 DIAGNOSIS — M5022 Other cervical disc displacement, mid-cervical region: Secondary | ICD-10-CM | POA: Insufficient documentation

## 2015-02-28 DIAGNOSIS — Z880 Allergy status to penicillin: Secondary | ICD-10-CM | POA: Diagnosis not present

## 2015-02-28 DIAGNOSIS — F329 Major depressive disorder, single episode, unspecified: Secondary | ICD-10-CM | POA: Insufficient documentation

## 2015-02-28 DIAGNOSIS — M5020 Other cervical disc displacement, unspecified cervical region: Secondary | ICD-10-CM | POA: Diagnosis present

## 2015-02-28 DIAGNOSIS — F419 Anxiety disorder, unspecified: Secondary | ICD-10-CM | POA: Diagnosis not present

## 2015-02-28 DIAGNOSIS — Z87891 Personal history of nicotine dependence: Secondary | ICD-10-CM | POA: Insufficient documentation

## 2015-02-28 DIAGNOSIS — G43909 Migraine, unspecified, not intractable, without status migrainosus: Secondary | ICD-10-CM | POA: Diagnosis not present

## 2015-02-28 DIAGNOSIS — Z881 Allergy status to other antibiotic agents status: Secondary | ICD-10-CM | POA: Insufficient documentation

## 2015-02-28 DIAGNOSIS — Z419 Encounter for procedure for purposes other than remedying health state, unspecified: Secondary | ICD-10-CM

## 2015-02-28 DIAGNOSIS — K219 Gastro-esophageal reflux disease without esophagitis: Secondary | ICD-10-CM | POA: Diagnosis not present

## 2015-02-28 HISTORY — PX: ANTERIOR CERVICAL DECOMP/DISCECTOMY FUSION: SHX1161

## 2015-02-28 SURGERY — ANTERIOR CERVICAL DECOMPRESSION/DISCECTOMY FUSION 1 LEVEL
Anesthesia: General

## 2015-02-28 MED ORDER — HYDROMORPHONE HCL 1 MG/ML IJ SOLN: 0.5000 mg | INTRAMUSCULAR | Status: DC | PRN

## 2015-02-28 MED ORDER — FENTANYL CITRATE (PF) 100 MCG/2ML IJ SOLN
25.0000 ug | INTRAMUSCULAR | Status: DC | PRN
  Administered 2015-02-28 (×3): 50 ug via INTRAVENOUS

## 2015-02-28 MED ORDER — ACETAMINOPHEN 325 MG PO TABS: 650.0000 mg | ORAL_TABLET | ORAL | Status: DC | PRN

## 2015-02-28 MED ORDER — FENTANYL CITRATE (PF) 250 MCG/5ML IJ SOLN
INTRAMUSCULAR | Status: AC
  Filled 2015-02-28: qty 5

## 2015-02-28 MED ORDER — LIDOCAINE HCL (CARDIAC) 20 MG/ML IV SOLN
INTRAVENOUS | Status: DC | PRN
Start: 2015-02-28 — End: 2015-02-28
  Administered 2015-02-28: 100 mg via INTRAVENOUS

## 2015-02-28 MED ORDER — SODIUM CHLORIDE 0.9 % IV SOLN: 250.0000 mL | INTRAVENOUS | Status: DC

## 2015-02-28 MED ORDER — MENTHOL 3 MG MT LOZG: 1.0000 | LOZENGE | OROMUCOSAL | Status: DC | PRN

## 2015-02-28 MED ORDER — GLYCOPYRROLATE 0.2 MG/ML IJ SOLN
INTRAMUSCULAR | Status: DC | PRN
  Administered 2015-02-28: .4 mg via INTRAVENOUS

## 2015-02-28 MED ORDER — ROCURONIUM BROMIDE 50 MG/5ML IV SOLN
INTRAVENOUS | Status: AC
  Filled 2015-02-28: qty 1

## 2015-02-28 MED ORDER — LIDOCAINE HCL (CARDIAC) 20 MG/ML IV SOLN
INTRAVENOUS | Status: AC
  Filled 2015-02-28: qty 10

## 2015-02-28 MED ORDER — ALUM & MAG HYDROXIDE-SIMETH 200-200-20 MG/5ML PO SUSP: 30.0000 mL | Freq: Four times a day (QID) | ORAL | Status: DC | PRN

## 2015-02-28 MED ORDER — DEXAMETHASONE SODIUM PHOSPHATE 4 MG/ML IJ SOLN
INTRAMUSCULAR | Status: DC | PRN
  Administered 2015-02-28: 4 mg via INTRAVENOUS

## 2015-02-28 MED ORDER — BISACODYL 5 MG PO TBEC
5.0000 mg | DELAYED_RELEASE_TABLET | Freq: Every day | ORAL | Status: DC | PRN
  Filled 2015-02-28: qty 1

## 2015-02-28 MED ORDER — SODIUM CHLORIDE 0.9 % IR SOLN
Status: DC | PRN
  Administered 2015-02-28: 12:00:00

## 2015-02-28 MED ORDER — ROCURONIUM BROMIDE 100 MG/10ML IV SOLN
INTRAVENOUS | Status: DC | PRN
  Administered 2015-02-28: 40 mg via INTRAVENOUS

## 2015-02-28 MED ORDER — SODIUM CHLORIDE 0.9 % IJ SOLN: 3.0000 mL | INTRAMUSCULAR | Status: DC | PRN

## 2015-02-28 MED ORDER — NEOSTIGMINE METHYLSULFATE 10 MG/10ML IV SOLN
INTRAVENOUS | Status: DC | PRN
  Administered 2015-02-28: 3 mg via INTRAVENOUS

## 2015-02-28 MED ORDER — PHENOL 1.4 % MT LIQD
1.0000 | OROMUCOSAL | Status: DC | PRN
Start: 2015-02-28 — End: 2015-02-28

## 2015-02-28 MED ORDER — FENTANYL CITRATE (PF) 100 MCG/2ML IJ SOLN
INTRAMUSCULAR | Status: AC
  Administered 2015-02-28: 50 ug via INTRAVENOUS
  Filled 2015-02-28: qty 2

## 2015-02-28 MED ORDER — ONDANSETRON HCL 4 MG/2ML IJ SOLN
INTRAMUSCULAR | Status: AC
  Filled 2015-02-28: qty 4

## 2015-02-28 MED ORDER — MIDAZOLAM HCL 2 MG/2ML IJ SOLN
INTRAMUSCULAR | Status: AC
  Filled 2015-02-28: qty 2

## 2015-02-28 MED ORDER — OXYCODONE HCL 10 MG PO TABS: 10.0000 mg | ORAL_TABLET | ORAL | Status: AC | PRN

## 2015-02-28 MED ORDER — PROPOFOL 10 MG/ML IV BOLUS
INTRAVENOUS | Status: AC
  Filled 2015-02-28: qty 20

## 2015-02-28 MED ORDER — METHOCARBAMOL 500 MG PO TABS
ORAL_TABLET | ORAL | Status: AC
Start: 2015-02-28 — End: 2015-02-28
  Administered 2015-02-28: 500 mg via ORAL
  Filled 2015-02-28: qty 1

## 2015-02-28 MED ORDER — DEXAMETHASONE SODIUM PHOSPHATE 4 MG/ML IJ SOLN: 4.0000 mg | Freq: Four times a day (QID) | INTRAMUSCULAR | Status: DC

## 2015-02-28 MED ORDER — MEPERIDINE HCL 25 MG/ML IJ SOLN: 6.2500 mg | INTRAMUSCULAR | Status: DC | PRN

## 2015-02-28 MED ORDER — PROMETHAZINE HCL 25 MG/ML IJ SOLN: 6.2500 mg | INTRAMUSCULAR | Status: DC | PRN

## 2015-02-28 MED ORDER — HYDROMORPHONE HCL 1 MG/ML IJ SOLN
1.0000 mg | INTRAMUSCULAR | Status: DC | PRN
  Administered 2015-02-28: 1 mg via INTRAMUSCULAR
  Filled 2015-02-28: qty 1

## 2015-02-28 MED ORDER — ONDANSETRON HCL 4 MG/2ML IJ SOLN
INTRAMUSCULAR | Status: DC | PRN
  Administered 2015-02-28 (×2): 4 mg via INTRAVENOUS

## 2015-02-28 MED ORDER — HEMOSTATIC AGENTS (NO CHARGE) OPTIME
TOPICAL | Status: DC | PRN
  Administered 2015-02-28: 1 via TOPICAL

## 2015-02-28 MED ORDER — ONDANSETRON HCL 4 MG/2ML IJ SOLN
4.0000 mg | INTRAMUSCULAR | Status: DC | PRN
  Administered 2015-02-28: 4 mg via INTRAVENOUS
  Filled 2015-02-28: qty 2

## 2015-02-28 MED ORDER — DEXAMETHASONE 4 MG PO TABS
4.0000 mg | ORAL_TABLET | Freq: Four times a day (QID) | ORAL | Status: DC
  Administered 2015-02-28: 4 mg via ORAL
  Filled 2015-02-28: qty 1

## 2015-02-28 MED ORDER — SODIUM CHLORIDE 0.9 % IJ SOLN
INTRAMUSCULAR | Status: AC
  Filled 2015-02-28: qty 10

## 2015-02-28 MED ORDER — ONDANSETRON HCL 4 MG/2ML IJ SOLN
INTRAMUSCULAR | Status: AC
  Filled 2015-02-28: qty 2

## 2015-02-28 MED ORDER — METHOCARBAMOL 500 MG PO TABS
500.0000 mg | ORAL_TABLET | Freq: Four times a day (QID) | ORAL | Status: DC | PRN
  Administered 2015-02-28: 500 mg via ORAL
  Filled 2015-02-28: qty 1

## 2015-02-28 MED ORDER — KCL IN DEXTROSE-NACL 20-5-0.45 MEQ/L-%-% IV SOLN
80.0000 mL/h | INTRAVENOUS | Status: DC
  Filled 2015-02-28 (×2): qty 1000

## 2015-02-28 MED ORDER — OXYCODONE-ACETAMINOPHEN 5-325 MG PO TABS
ORAL_TABLET | ORAL | Status: AC
  Administered 2015-02-28: 2 via ORAL
  Filled 2015-02-28: qty 2

## 2015-02-28 MED ORDER — GLYCOPYRROLATE 0.2 MG/ML IJ SOLN
INTRAMUSCULAR | Status: AC
  Filled 2015-02-28: qty 2

## 2015-02-28 MED ORDER — HYDROMORPHONE HCL 1 MG/ML IJ SOLN
INTRAMUSCULAR | Status: AC
  Administered 2015-02-28: 1 mg
  Filled 2015-02-28: qty 1

## 2015-02-28 MED ORDER — PROPOFOL 10 MG/ML IV BOLUS
INTRAVENOUS | Status: DC | PRN
  Administered 2015-02-28: 170 mg via INTRAVENOUS

## 2015-02-28 MED ORDER — 0.9 % SODIUM CHLORIDE (POUR BTL) OPTIME
TOPICAL | Status: DC | PRN
  Administered 2015-02-28: 1000 mL

## 2015-02-28 MED ORDER — PANTOPRAZOLE SODIUM 40 MG IV SOLR
40.0000 mg | Freq: Every day | INTRAVENOUS | Status: DC
  Filled 2015-02-28: qty 40

## 2015-02-28 MED ORDER — MIDAZOLAM HCL 5 MG/5ML IJ SOLN
INTRAMUSCULAR | Status: DC | PRN
  Administered 2015-02-28: 2 mg via INTRAVENOUS

## 2015-02-28 MED ORDER — ACETAMINOPHEN 650 MG RE SUPP: 650.0000 mg | RECTAL | Status: DC | PRN

## 2015-02-28 MED ORDER — THROMBIN 5000 UNITS EX SOLR
CUTANEOUS | Status: DC | PRN
  Administered 2015-02-28 (×2): 5000 [IU] via TOPICAL

## 2015-02-28 MED ORDER — PHENYLEPHRINE 40 MCG/ML (10ML) SYRINGE FOR IV PUSH (FOR BLOOD PRESSURE SUPPORT)
PREFILLED_SYRINGE | INTRAVENOUS | Status: AC
  Filled 2015-02-28: qty 10

## 2015-02-28 MED ORDER — LACTATED RINGERS IV SOLN: INTRAVENOUS | Status: DC

## 2015-02-28 MED ORDER — SODIUM CHLORIDE 0.9 % IJ SOLN: 3.0000 mL | Freq: Two times a day (BID) | INTRAMUSCULAR | Status: DC

## 2015-02-28 MED ORDER — FENTANYL CITRATE (PF) 100 MCG/2ML IJ SOLN
INTRAMUSCULAR | Status: DC | PRN
  Administered 2015-02-28 (×7): 50 ug via INTRAVENOUS

## 2015-02-28 MED ORDER — NEOSTIGMINE METHYLSULFATE 10 MG/10ML IV SOLN
INTRAVENOUS | Status: AC
  Filled 2015-02-28: qty 1

## 2015-02-28 MED ORDER — LACTATED RINGERS IV SOLN
INTRAVENOUS | Status: DC
  Administered 2015-02-28 (×2): via INTRAVENOUS

## 2015-02-28 MED ORDER — EPHEDRINE SULFATE 50 MG/ML IJ SOLN
INTRAMUSCULAR | Status: AC
  Filled 2015-02-28: qty 1

## 2015-02-28 MED ORDER — LIDOCAINE HCL 4 % MT SOLN
OROMUCOSAL | Status: DC | PRN
  Administered 2015-02-28: 4 mL via TOPICAL

## 2015-02-28 MED ORDER — DEXAMETHASONE SODIUM PHOSPHATE 4 MG/ML IJ SOLN
INTRAMUSCULAR | Status: AC
  Filled 2015-02-28: qty 1

## 2015-02-28 MED ORDER — DOCUSATE SODIUM 100 MG PO CAPS: 100.0000 mg | ORAL_CAPSULE | Freq: Two times a day (BID) | ORAL | Status: DC

## 2015-02-28 MED ORDER — METHOCARBAMOL 1000 MG/10ML IJ SOLN
500.0000 mg | Freq: Four times a day (QID) | INTRAMUSCULAR | Status: DC | PRN
  Filled 2015-02-28: qty 5

## 2015-02-28 MED ORDER — OXYCODONE-ACETAMINOPHEN 5-325 MG PO TABS
1.0000 | ORAL_TABLET | ORAL | Status: DC | PRN
  Administered 2015-02-28: 2 via ORAL

## 2015-02-28 SURGICAL SUPPLY — 56 items
BAG DECANTER FOR FLEXI CONT (MISCELLANEOUS) ×3 IMPLANT
BENZOIN TINCTURE PRP APPL 2/3 (GAUZE/BANDAGES/DRESSINGS) ×3 IMPLANT
BRUSH SCRUB EZ PLAIN DRY (MISCELLANEOUS) ×3 IMPLANT
BUR MATCHSTICK NEURO 3.0 LAGG (BURR) ×3 IMPLANT
CANISTER SUCT 3000ML PPV (MISCELLANEOUS) ×3 IMPLANT
CLOSURE WOUND 1/2 X4 (GAUZE/BANDAGES/DRESSINGS) ×1
CONT SPEC 4OZ CLIKSEAL STRL BL (MISCELLANEOUS) ×3 IMPLANT
DRAPE C-ARM 42X72 X-RAY (DRAPES) ×6 IMPLANT
DRAPE LAPAROTOMY 100X72 PEDS (DRAPES) ×3 IMPLANT
DRAPE MICROSCOPE LEICA (MISCELLANEOUS) ×3 IMPLANT
DRAPE POUCH INSTRU U-SHP 10X18 (DRAPES) ×3 IMPLANT
DRAPE SURG 17X23 STRL (DRAPES) ×6 IMPLANT
DRSG OPSITE POSTOP 4X6 (GAUZE/BANDAGES/DRESSINGS) ×3 IMPLANT
DRSG TELFA 3X8 NADH (GAUZE/BANDAGES/DRESSINGS) ×3 IMPLANT
DURAPREP 6ML APPLICATOR 50/CS (WOUND CARE) ×3 IMPLANT
ELECT COATED BLADE 2.86 ST (ELECTRODE) ×3 IMPLANT
ELECT REM PT RETURN 9FT ADLT (ELECTROSURGICAL) ×3
ELECTRODE REM PT RTRN 9FT ADLT (ELECTROSURGICAL) ×1 IMPLANT
GAUZE SPONGE 4X4 12PLY STRL (GAUZE/BANDAGES/DRESSINGS) ×3 IMPLANT
GAUZE SPONGE 4X4 16PLY XRAY LF (GAUZE/BANDAGES/DRESSINGS) IMPLANT
GLOVE BIO SURGEON STRL SZ8 (GLOVE) ×3 IMPLANT
GLOVE BIO SURGEON STRL SZ8.5 (GLOVE) ×3 IMPLANT
GLOVE ECLIPSE 8.0 STRL XLNG CF (GLOVE) ×3 IMPLANT
GLOVE EXAM NITRILE LRG STRL (GLOVE) IMPLANT
GLOVE EXAM NITRILE XL STR (GLOVE) IMPLANT
GLOVE EXAM NITRILE XS STR PU (GLOVE) IMPLANT
GOWN STRL REUS W/ TWL LRG LVL3 (GOWN DISPOSABLE) IMPLANT
GOWN STRL REUS W/ TWL XL LVL3 (GOWN DISPOSABLE) ×2 IMPLANT
GOWN STRL REUS W/TWL 2XL LVL3 (GOWN DISPOSABLE) ×3 IMPLANT
GOWN STRL REUS W/TWL LRG LVL3 (GOWN DISPOSABLE)
GOWN STRL REUS W/TWL XL LVL3 (GOWN DISPOSABLE) ×4
HALTER HD/CHIN CERV TRACTION D (MISCELLANEOUS) ×3 IMPLANT
KIT BASIN OR (CUSTOM PROCEDURE TRAY) ×3 IMPLANT
KIT ROOM TURNOVER OR (KITS) ×3 IMPLANT
NEEDLE SPNL 20GX3.5 QUINCKE YW (NEEDLE) ×3 IMPLANT
NS IRRIG 1000ML POUR BTL (IV SOLUTION) ×3 IMPLANT
PACK LAMINECTOMY NEURO (CUSTOM PROCEDURE TRAY) ×3 IMPLANT
PAD ARMBOARD 7.5X6 YLW CONV (MISCELLANEOUS) ×6 IMPLANT
PATTIES SURGICAL .25X.25 (GAUZE/BANDAGES/DRESSINGS) IMPLANT
PATTIES SURGICAL .75X.75 (GAUZE/BANDAGES/DRESSINGS) IMPLANT
PLATE 22MM (Plate) ×3 IMPLANT
PUTTY BONE GRAFT KIT 2.5ML (Bone Implant) ×3 IMPLANT
RUBBERBAND STERILE (MISCELLANEOUS) ×6 IMPLANT
SCREW SD FIXED 12MM (Screw) ×12 IMPLANT
SPACER TM-S 11X14X5-7 (Spacer) ×3 IMPLANT
SPONGE INTESTINAL PEANUT (DISPOSABLE) ×3 IMPLANT
SPONGE SURGIFOAM ABS GEL SZ50 (HEMOSTASIS) ×3 IMPLANT
STRIP CLOSURE SKIN 1/2X4 (GAUZE/BANDAGES/DRESSINGS) ×2 IMPLANT
SUT PDS AB 5-0 P3 18 (SUTURE) ×3 IMPLANT
SUT VIC AB 3-0 CP2 18 (SUTURE) ×6 IMPLANT
SUT VIC AB 3-0 SH 8-18 (SUTURE) ×3 IMPLANT
SYR 20ML ECCENTRIC (SYRINGE) IMPLANT
TOWEL OR 17X24 6PK STRL BLUE (TOWEL DISPOSABLE) ×3 IMPLANT
TOWEL OR 17X26 10 PK STRL BLUE (TOWEL DISPOSABLE) ×3 IMPLANT
TRAP SPECIMEN MUCOUS 40CC (MISCELLANEOUS) ×3 IMPLANT
WATER STERILE IRR 1000ML POUR (IV SOLUTION) ×3 IMPLANT

## 2015-02-28 NOTE — Progress Notes (Signed)
Orthopedic Tech Progress Note Patient Details:  Rachael Howard 1981-03-31 [id number] Patient already has collar. Patient ID: DAI APEL, female   DOB: 01-17-1981, 34 y.o.   MRN: [id number]   [name] 02/28/2015, 4:25 PM

## 2015-02-28 NOTE — Op Note (Signed)
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 Localizing fluoroscopy was used prior to incision to identify the appropriate level. Transverse incision was made in the right anterior neck started the midline and headed towards the medial aspect of the sternocleidomastoid muscle. The platysma muscle was then incised transversely. Natural fascial plane between the strap muscles medially and the sternal cremaster laterally was identified and followed down to the anterior aspect of the cervical spine. The longus Cole muscles were identified split the midline and stripped away bilaterally with Kitner section kilo elevator. Subcutaneous tract was placed for exposure and x-ray showed approach the appropriate level. Disc at C5-6 was incised. Using pituitary rongeurs and curettes thorough disc space cleanout was carried out while the same time great care was taken to avoid injury to the neural elements this was successfully done. High-speed drill was used to widen the interspace and bony shavings were saved for use later in the case. At this time the microscope was draped brought in the field and used for the remainder of the case. Using microsecond technique the remainder of the disc material down the posterior longitudinal ligament was removed. Ligament was then incised transversely and the cut edges removed a Kerrison punch. Thorough decompression was carried out along the spinal dura into the foramen bilaterally. Large amounts of herniated disc material were identified towards the left and this was removed decompressing visualized the underlying thecal sac and C6 nerve root. Similar  decompression was then carried out on the right, astigmatic side. At this time inspection was carried out in all directions for any evidence of residual compression and none could be identified. Irrigation was carried out and any bleeding control proper coagulation Gelfoam. Measurements were taken and a 5 mm trabecular metal lordotic graft was chosen and filled with a mixture of autologous bone and morselized allograft. After confirming hemostasis once more the plug was impacted difficulty and fossae showed to be in good position. An appropriately length Trinica anterior cervical plate was then chosen. Self drilling screws were then placed in the locking mechanism was rotated locked position. Final fluoroscopy showed good position of the plates screws and plugs. Irrigation was carried out and any bleeding control proper coagulation. The wounds and closed with inverted Vicryls on the platysma muscle subcuticular layer and Steri-Strips were placed on the skin. A sterile dressing was then applied the patient was extubated and taken to recovery room in stable condition.

## 2015-02-28 NOTE — Anesthesia Procedure Notes (Signed)
Procedure Name: Intubation Performed by: Merdis Delay Pre-anesthesia Checklist: Patient identified, Timeout performed, Emergency Drugs available, Suction available and Patient being monitored Patient Re-evaluated:Patient Re-evaluated prior to inductionOxygen Delivery Method: Circle system utilized Intubation Type: IV induction Ventilation: Mask ventilation without difficulty Laryngoscope Size: Mac and 3 Grade View: Grade II Tube type: Oral Tube size: 7.0 mm Number of attempts: 2 Airway Equipment and Method: Stylet and LTA kit utilized Placement Confirmation: ETT inserted through vocal cords under direct vision,  breath sounds checked- equal and bilateral,  CO2 detector and positive ETCO2 Secured at: 22 cm Tube secured with: Tape Dental Injury: Teeth and Oropharynx as per pre-operative assessment  Comments: DL with MAC 3 by Dr. Tresa Moore

## 2015-02-28 NOTE — Progress Notes (Signed)
Pt doing well. Pt is OOB independently. Pt has voided. Pt and mother given D/C instructions with Rx, verbal understanding was provided. Pt's IV was removed prior to D/C. Pt's incision is covered with dressing and has no drainage. Pt D/C'd home via wheelchair @ 1840 per MD order. Pt is stable @ D/C and has no other needs at this time. Holli Humbles, RN

## 2015-02-28 NOTE — Progress Notes (Signed)
Rash noted to her neck after using the CHG clothes, area wiped with wash clothes and cool was cloth applied to area. States that it feels better.

## 2015-02-28 NOTE — Transfer of Care (Signed)
Immediate Anesthesia Transfer of Care Note  Patient: Rachael Howard  Procedure(s) Performed: Procedure(s): ANTERIOR CERVICAL DECOMPRESSION/DISCECTOMY FUSION 1 LEVEL (N/A)  Patient Location: PACU  Anesthesia Type:General  Level of Consciousness: awake, alert  and oriented  Airway & Oxygen Therapy: Patient Spontanous Breathing and Patient connected to nasal cannula oxygen  Post-op Assessment: Report given to RN and Post -op Vital signs reviewed and stable  Post vital signs: Reviewed and stable  Last Vitals:  Filed Vitals:   02/28/15 0839  BP: 141/96  Pulse: 78  Temp: 36.6 C  Resp: 20    Complications: No apparent anesthesia complications

## 2015-02-28 NOTE — Anesthesia Postprocedure Evaluation (Signed)
  Anesthesia Post-op Note  Patient: Rachael Howard  Procedure(s) Performed: Procedure(s): ANTERIOR CERVICAL DECOMPRESSION/DISCECTOMY FUSION CERVICAL FIVE- CERVICAL SIX (N/A)  Patient Location: PACU  Anesthesia Type:General  Level of Consciousness: awake and alert   Airway and Oxygen Therapy: Patient Spontanous Breathing  Post-op Pain: mild  Post-op Assessment: Post-op Vital signs reviewed, Patient's Cardiovascular Status Stable, Respiratory Function Stable, Patent Airway and No signs of Nausea or vomiting              Post-op Vital Signs: Reviewed and stable  Last Vitals:  Filed Vitals:   02/28/15 1315  BP: 131/78  Pulse:   Temp:   Resp:     Complications: No apparent anesthesia complications

## 2015-02-28 NOTE — Discharge Summary (Signed)
  Physician Discharge Summary  Patient ID: Rachael Howard MRN: [id number] DOB/AGE: [birth year] 34 y.o.  Admit date: 02/28/2015 Discharge date: 02/28/2015  Admission Diagnoses:  Discharge Diagnoses:  Active Problems:   Herniated cervical disc   Discharged Condition: good  Hospital Course: Surgery this morning for hernaited disc at C 56. Did well with ggod relief of left arm pain. Ambulated without dificulty. Wound clean and dry. Home same day with specific instructions given.  Consults: None  Significant Diagnostic Studies: none  Treatments: surgery: C 56 acdf  Discharge Exam: Blood pressure 147/97, pulse 88, temperature 98 F (36.7 C), temperature source Oral, resp. rate 16, height 5\' 4"  (1.626 m), weight 76.613 kg (168 lb 14.4 oz), SpO2 93 %. Incision/Wound: clean and dry; no new neuro issues noted Disposition: 02-Short Term Hospital     Medication List    ASK your doctor about these medications        diphenhydrAMINE 25 MG tablet  Commonly known as:  BENADRYL  Take 25 mg by mouth at bedtime as needed for allergies.         At home rest most of the time. Get up 9 or 10 times each day and take a 15 or 20 minute walk. No riding in the car and to your first postoperative appointment. If you have neck surgery you may shower from the chest down starting on the third postoperative day. If you had back surgery he may start showering on the third postoperative day with saran wrap wrapped around your incisional area 3 times. After the shower remove the saran wrap. Take pain medicine as needed and other medications as instructed. Call my office for an appointment.  Signed: Faythe Ghee, MD 02/28/2015, 5:10 PM

## 2015-02-28 NOTE — Progress Notes (Addendum)
Pt states she is allergic to all mycins and penicillins. Dr Lorna Dibble called and informed, states to call pharmacy and ask them what to order. Pharmacy called and informed, spoke with [facility], states that vancomycin should be fine to use. Dr Tresa Moore called and informed of above and that the patient still feels nervous about taking the Vanco. States that he will start it slow and watch her. Informed pt. Pt still voices concerns, encouraged her to speak with the the Drs before surgery.
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
## 2015-03-01 ENCOUNTER — Encounter (HOSPITAL_COMMUNITY): Payer: Self-pay | Admitting: Neurosurgery

## 2015-07-31 ENCOUNTER — Other Ambulatory Visit: Payer: Self-pay | Admitting: Obstetrics and Gynecology

## 2015-08-01 LAB — CYTOLOGY - PAP

## 2015-11-06 IMAGING — RF DG CERVICAL SPINE 1V
1 series · 1 of 1 positions shown · non-contrast
Comparison: None.

CLINICAL DATA: C5-6 anterior cervical disc fusion

EXAM:
DG C-ARM 61-120 MIN; DG CERVICAL SPINE - 1 VIEW

[Series 1: run · 1 of 1 slices shown]
[im 1/1]
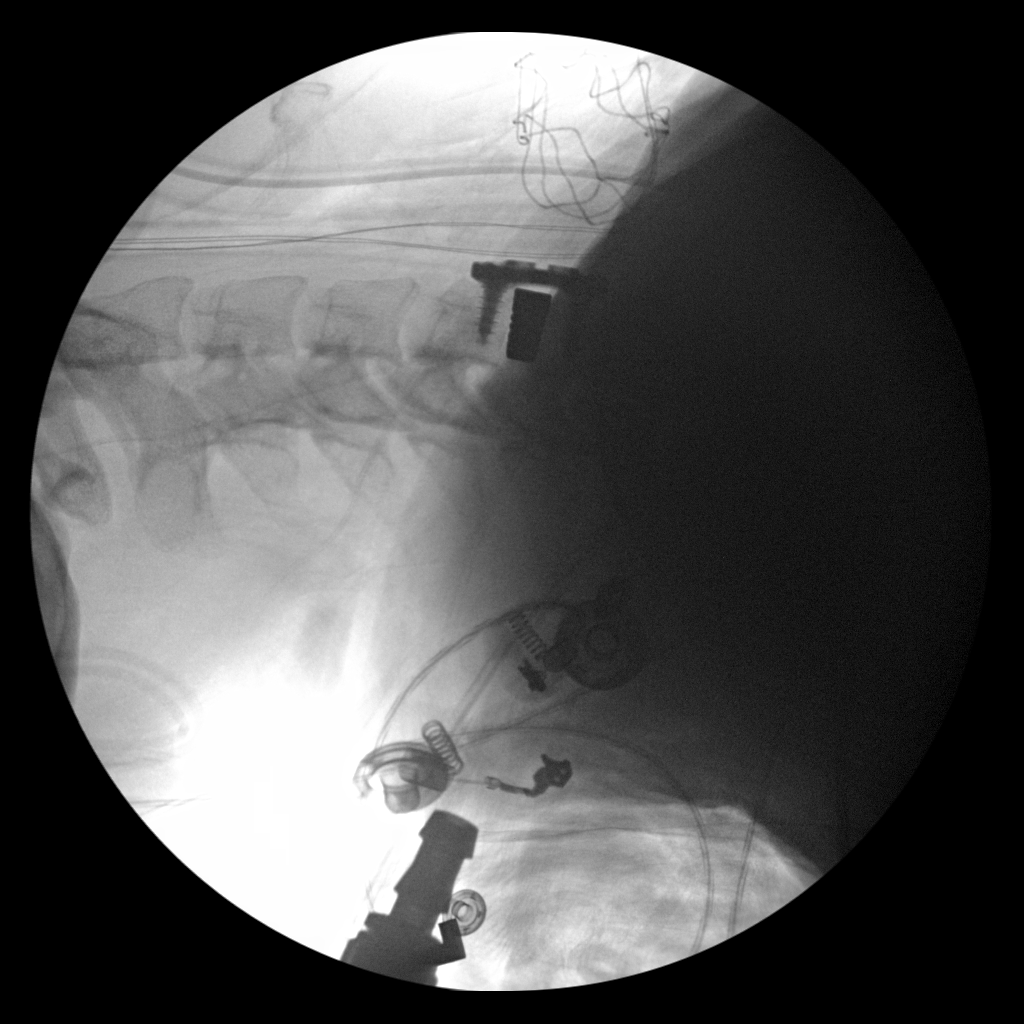

[1 of 1 positions shown; findings below may reference images not displayed]

FINDINGS: A single lateral C-arm view of the cervical spine demonstrates
interbody hardware plug and anterior screw and plate fusion at the
C5-6 level. Normal alignment to the C6 level. The inferior C6 level
and lower obscured by the patient's shoulders.
IMPRESSION: Anterior cervical fusion at the C5-6 level.
